# Patient Record
Sex: Male | Born: 1950
Health system: Southern US, Community
[De-identification: ages and names within clinical notes are randomized; demographics above are authoritative.]

## PROBLEM LIST (undated history)

## (undated) DIAGNOSIS — F419 Anxiety disorder, unspecified: Secondary | ICD-10-CM

## (undated) DIAGNOSIS — G4733 Obstructive sleep apnea (adult) (pediatric): Secondary | ICD-10-CM

## (undated) DIAGNOSIS — G47419 Narcolepsy without cataplexy: Principal | ICD-10-CM

## (undated) DIAGNOSIS — I10 Essential (primary) hypertension: Secondary | ICD-10-CM

## (undated) DIAGNOSIS — S0990XA Unspecified injury of head, initial encounter: Secondary | ICD-10-CM

## (undated) DIAGNOSIS — G43909 Migraine, unspecified, not intractable, without status migrainosus: Secondary | ICD-10-CM

## (undated) DIAGNOSIS — M1990 Unspecified osteoarthritis, unspecified site: Secondary | ICD-10-CM

## (undated) DIAGNOSIS — G4730 Sleep apnea, unspecified: Secondary | ICD-10-CM

## (undated) DIAGNOSIS — E119 Type 2 diabetes mellitus without complications: Secondary | ICD-10-CM

## (undated) HISTORY — DX: Sleep apnea, unspecified: G47.30

## (undated) HISTORY — DX: Obstructive sleep apnea (adult) (pediatric): G47.33

## (undated) HISTORY — DX: Migraine, unspecified, not intractable, without status migrainosus: G43.909

## (undated) HISTORY — DX: Narcolepsy without cataplexy: G47.419

## (undated) HISTORY — DX: Unspecified osteoarthritis, unspecified site: M19.90

## (undated) HISTORY — PX: TRIGGER FINGER RELEASE: SHX641

## (undated) HISTORY — DX: Anxiety disorder, unspecified: F41.9

## (undated) HISTORY — DX: Unspecified injury of head, initial encounter: S09.90XA

---

## 2005-07-05 ENCOUNTER — Inpatient Hospital Stay (HOSPITAL_COMMUNITY): Admission: EM | Admit: 2005-07-05 | Discharge: 2005-07-07 | Payer: Self-pay | Admitting: Emergency Medicine

## 2007-01-30 ENCOUNTER — Encounter (INDEPENDENT_AMBULATORY_CARE_PROVIDER_SITE_OTHER): Payer: Self-pay | Admitting: General Surgery

## 2007-01-30 ENCOUNTER — Inpatient Hospital Stay (HOSPITAL_COMMUNITY): Admission: EM | Admit: 2007-01-30 | Discharge: 2007-02-02 | Payer: Self-pay | Admitting: Emergency Medicine

## 2007-02-04 ENCOUNTER — Emergency Department (HOSPITAL_COMMUNITY): Admission: EM | Admit: 2007-02-04 | Discharge: 2007-02-04 | Payer: Self-pay | Admitting: Emergency Medicine

## 2008-01-07 HISTORY — PX: COLON SURGERY: SHX602

## 2008-06-06 ENCOUNTER — Emergency Department (HOSPITAL_COMMUNITY): Admission: EM | Admit: 2008-06-06 | Discharge: 2008-06-07 | Payer: Self-pay | Admitting: Emergency Medicine

## 2010-04-15 LAB — GLUCOSE, CAPILLARY: Glucose-Capillary: 192 mg/dL — ABNORMAL HIGH (ref 70–99)

## 2010-05-21 NOTE — H&P (Signed)
[name], [name]              ACCOUNT NO.:  [id number]   MEDICAL RECORD NO.:  [id number]          PATIENT TYPE:  INP   LOCATION:  1539                         FACILITY:  [facility]   PHYSICIAN:  Sharlet Salina T. Hoxworth, M.D.DATE OF BIRTH:  02-09-1950   DATE OF ADMISSION:  01/30/2007  DATE OF DISCHARGE:                              HISTORY & PHYSICAL   CHIEF COMPLAINT:  Severe rectal pain and swelling.   HISTORY OF PRESENT ILLNESS:  [name] is a 60 year old white male  diabetic who while straining had a bowel movement now about 6 hours ago,  developed the acute onset of rectal pain and swelling.  This was  described as severe, and presented to the [facility] emergency room.  He had some bleeding with his bowel movement, as well.  At the time of  my history, the patient has received some Valium in the ER and is a poor  historian, history taken essentially from his wife.  He apparently has  some occasional minor hemorrhoidal irritation, but no severe chronic  symptoms or anything similar to this previously.   PAST MEDICAL HISTORY:  He is a juvenile insulin-dependent diabetic, also  hypertension, severe sleep apnea for which he uses a CPAP, and has  migraine headaches.   MEDICATIONS:  Are:  1. Insulin 75/25 26 units in the morning, 36 units in the evening plus      occasional sliding scale.  2. Lisinopril 40 mg daily.  3. Citalopram 20 mg daily.  4. Clonidine 0.1 mg b.i.d.  5. Metoprolol 50 mg a day.  6. Prilosec daily.   ALLERGIES:  MORPHINE AND CODEINE AND QUESTIONABLE REACTION TO ALL  NARCOTICS, BUT HIS WIFE IS NOT SURE HE HAS HAD ANY OTHER NARCOTICS WITH  MORPHINE.   SOCIAL HISTORY:  He is married.  No cigarettes or alcohol.  Recently  laid off from work, unemployed.   FAMILY HISTORY:  Noncontributory.   REVIEW OF SYSTEMS:  GENERAL:  No fever, chills or weight change.  RESPIRATORY:  No shortness breath, cough, wheezing, history of lung  disease.  CARDIAC:  No chest  pain, palpitations, history of heart disease.  ABDOMEN:  GI no abdominal pain, nausea, vomiting.   PHYSICAL EXAM:  VITAL SIGNS:  Temperature is 99.1, pulse 84,  respirations 21, blood pressure 185/90.  GENERAL:  Mildly obese white male, responsive but somewhat sedated as  above.  SKIN:  Warm and dry.  HEENT:  No mass or thyromegaly.  Sclerae nonicteric.  No cervical or  subclavicular lymph nodes palpable.  LUNGS:  Clear without wheezing, increased work of breathing.  CARDIAC:  Regular rate and rhythm.  No edema.  No murmurs.  ABDOMEN:  Mildly obese, soft, nontender.  No masses.  RECTAL:  There are severe circumferential prolapsed internal hemorrhoids  with marked edema and early gangrenous changes, exquisitely tender.  EXTREMITIES:  No joint swelling deformity.  NEUROLOGIC:  Groggy after Valium.  Verbally responsive.  Moves  extremities x4.   LABORATORY:  White count elevated at 16,000, hemoglobin 13.4,  electrolytes normal.  Glucose 209.  EKG:  Chest x-ray pending.  ASSESSMENT/PLAN:  Severe mucosal and hemorrhoidal prolapse with edema,  early gangrenous changes.  This will require reduction and  hemorrhoidectomy in the operating room.  This was discussed with the  patient and his wife.  Risks of bleeding, infection, anesthetic  medications and complications were discussed and understood.      Lorne Skeens. Hoxworth, M.D.  Electronically Signed     BTH/MEDQ  D:  01/30/2007  T:  01/30/2007  Job:  [id number]

## 2010-05-21 NOTE — Op Note (Signed)
[name], [name]              ACCOUNT NO.:  [id number]   MEDICAL RECORD NO.:  [id number]          PATIENT TYPE:  INP   LOCATION:  1539                         FACILITY:  [facility]   PHYSICIAN:  Sharlet Salina T. Hoxworth, M.D.DATE OF BIRTH:  [birth year]   DATE OF PROCEDURE:  01/30/2007  DATE OF DISCHARGE:                               OPERATIVE REPORT   PREOPERATIVE DIAGNOSIS:  Severe prolapsed circumferential thrombosed  internal and external hemorrhoids.   POSTOPERATIVE DIAGNOSIS:  Severe prolapsed circumferential thrombosed  internal and external hemorrhoids.   SURGICAL PROCEDURE:  Extensive hemorrhoidectomy.   SURGEON:  Lorne Skeens. Hoxworth, M.D.   ASSISTANT:  Sandria Bales. Ezzard Standing, M.D.   ANESTHESIA:  General.   BRIEF HISTORY:  [name] is a 60 year old diabetic male with a  history of some minor hemorrhoid symptoms in the past.  He had straining  of a bowel movement last night, and developed the onset of severe rectal  pain, swelling and some bleeding.  He presented to the emergency room  and on examination has circumferential prolapsed combination  hemorrhoids; with thrombosis, severe edema and early gangrenous changes.  I have recommended proceeding to the operating room for urgent  hemorrhoidectomy.  The nature of the procedure, its indications, risks  of bleeding, infection have been discussed and understood.  He was  brought to the operating room for this procedure.   DESCRIPTION OF OPERATION:  The patient was brought to the operating room  and placed in the supine position on the operating room table.  General  endotracheal anesthesia was induced.  He received cefoxitin  intravenously.  He was carefully positioned in the lithotomy position,  and the perineum was sterilely prepped and draped.   Again, examination revealed very severe combination internal and  external hemorrhoids, with circumferential prolapse, thrombosis and  marked edema.  The process, however, could  be delineated as most severe  in the right posterior, right anterior and left lateral positions.  I  elected to do a 3-quadrant elliptical hemorrhoidectomy in these areas.  The largest was the right posterior.  This hemorrhoid group was grasped  with a Pennington and a 3-0 chromic suture placed at the apex of the  group.  An elliptical excision was then performed of the large  thrombosed, edematous hemorrhoid group out to the perianal skin.  It was  then dissected up off of the sphincter muscle, carefully protecting and  preserving it.  Some thrombosed areas on either side were then excised,  undermining just slightly the anoderm on either side.  Hemostasis was  obtained with cautery and then the suture was used to close the incision  in a running locking fashion; with good hemostasis.   Following this, identical excisions were performed in the left lateral  area; and a somewhat more limited excision in the right anterior area.  This left just a few obvious discrete thrombosed internal and external  hemorrhoids, which were individually just lanced and clot evacuated.   At this point, all the mucosa was completely reduced.  A perianal block  of Marcaine with epinephrine was used.  The operative site was  inspected  for hemostasis, which appeared complete.  The patient was then taken to  the recovery room in good condition.      Lorne Skeens. Hoxworth, M.D.  Electronically Signed     BTH/MEDQ  D:  01/30/2007  T:  01/30/2007  Job:  [id number]

## 2010-05-24 NOTE — Discharge Summary (Signed)
[name], [name]              ACCOUNT NO.:  [id number]   MEDICAL RECORD NO.:  [id number]          PATIENT TYPE:  INP   LOCATION:  1539                         FACILITY:  [facility]   PHYSICIAN:  Sharlet Salina T. Hoxworth, M.D.DATE OF BIRTH:  27-Jun-1950   DATE OF ADMISSION:  01/30/2007  DATE OF DISCHARGE:  02/02/2007                               DISCHARGE SUMMARY   No dictation for this chart.      Lorne Skeens. Hoxworth, M.D.     Tory Emerald  D:  02/18/2007  T:  02/19/2007  Job:  [id number]

## 2010-05-24 NOTE — Discharge Summary (Signed)
Bird, Robert              ACCOUNT NO.:  [id number]   MEDICAL RECORD NO.:  [id number]          PATIENT TYPE:  INP   LOCATION:  1539                         FACILITY:  [facility]   PHYSICIAN:  Sharlet Salina T. Hoxworth, M.D.DATE OF BIRTH:  02/07/1950   DATE OF ADMISSION:  01/30/2007  DATE OF DISCHARGE:  02/02/2007                               DISCHARGE SUMMARY   DISCHARGE DIAGNOSES:  1. Severe prolapsed thrombosed hemorrhoids.  2. Insulin-dependent diabetes mellitus.  3. Hypertension.  4. Sleep apnea.   OPERATIONS AND PROCEDURES:  Extensive hemorrhoidectomy January 30, 2007.   HISTORY OF PRESENT ILLNESS:  Mr. Voiles is a 60 year old diabetic male  who, while straining a bowel movement 6 hours ago, developed acute onset  of rectal pain and swelling.  Described as severe and constant.  He  presented to [facility] emergency room.  He had some bleeding as well.  He has had some occasional mild hemorrhoid irritation but no severe  previous symptoms.   PAST MEDICAL HISTORY:  Significant for:  1. Juvenile insulin-dependent diabetes.  2. Hypertension.  3. Severe sleep apnea for which he uses CPAP.  4. Migraine headache.   MEDICATIONS:  1. Insulin 75/25, 26 units in the morning, 36 in the evening plus      sliding scale.  2. Lisinopril 40 daily.  3. Citalopram 20 daily.  4. Clonidine 0.1 b.i.d.  5. Metoprolol 50 daily.  6. Prilosec daily.   ALLERGIES:  MORPHINE, CODEINE and questionable reaction ALL NARCOTICS.   SOCIAL HISTORY FAMILY HISTORY REVIEW OF SYSTEMS:  See detailed H&P.   PERTINENT PHYSICAL EXAMINATION:  Limited to the rectal exam which shows  severe prolapsed circumferential internal and external hemorrhoids with  marked edema, early gangrenous changes, exquisite tenderness.   HOSPITAL COURSE:  The patient was admitted from the emergency room and  taken to the OR for urgent extensive hemorrhoidectomy.  Tolerated  procedure well.  Postoperatively, he had fairly good  pain control.  Glucose was monitored with sliding scale insulin.  He had some bloody  discharge but improved.  He was going to be discharged on January 26 but  had some mild shortness of breath off oxygen and hypoxemia with O2  saturation in the 80s off oxygen.  He was observed for another 24 hours,  ambulated, and with pulmonary toilet had no further shortness of breath.  O2 saturation was around 90 on room air,  and he was discharged home  January 27. Wounds are healing well.   DISCHARGE MEDICATIONS:  Are the same as admission plus Tylox for pain  and topical Xylocaine.   Will follow up in my office in 2 weeks.      Lorne Skeens. Hoxworth, M.D.  Electronically Signed     BTH/MEDQ  D:  02/18/2007  T:  02/19/2007  Job:  [id number]

## 2010-05-24 NOTE — Discharge Summary (Signed)
[name], [name]              ACCOUNT NO.:  [id number]   MEDICAL RECORD NO.:  [id number]          PATIENT TYPE:  INP   LOCATION:  4732                         FACILITY:  [facility]   PHYSICIAN:  Kari Baars, M.D.  DATE OF BIRTH:  [birth year]   DATE OF ADMISSION:  07/05/2005  DATE OF DISCHARGE:  07/07/2005                                 DISCHARGE SUMMARY   DISCHARGE DIAGNOSES:  1.  Hypertensive urgency.  2.  Headaches, question secondary to #1 versus migraine headache.  3.  Obstructive sleep apnea.  4.  Type 1 diabetes mellitus.  5.  Lower extremity edema.   DISCHARGE MEDICATIONS:  1.  Lisinopril 40 mg daily.  2.  Norvasc 5 mg daily.  3.  Insulin 75/25 25 units q.a.m., 34 units q.p.m.  4.  Prilosec 20 mg daily.  5.  Multivitamin.  6.  Darvocet-N 100 one q.6h. p.r.n. pain, #15, zero refills.   HOSPITAL PROCEDURES:  MRI/MRA of the brain (July 1):  No acute intracranial  abnormality.  Prominent artifact from the skull base slightly limits  evaluation of temporal lobes on diffusion weighted sequence.  Small 3-4 mm  aneurysm of the cavernous segment of the left internal carotid artery cannot  be excluded on current examination.  MRA otherwise normal.   HISTORY OF PRESENT ILLNESS:  For full details please see dictated history  and physical.  Briefly, [name] is a 60 year old white male with a  history of type 1 diabetes, obstructive sleep apnea, and hypertension who  presented to the emergency department on June 30 with severe headache.  The  patient reports a history of recurrent headaches for the past two years  which have been diagnosed at Urgent Care as migraines.  These had  traditionally been responsive to NSAID therapy and occasional Imitrex use.  However, on the day of admission he had a severe headache which was  unrelenting.  Symptoms associated with the headache included nausea and  vomiting and facial flushing.  He presented to the emergency department  where his  blood pressure was in the 220s/130s.  He was given Dilaudid and  Zofran in the emergency room with an episode of turning gray and becoming  much less responsive.  He was admitted for further management.  Head CT in  the emergency department showed no acute intracranial abnormality.   HOSPITAL COURSE:  Patient was admitted to a telemetry bed.  Morphine was  used to control his pain.  His antihypertensive regimen was escalated with  an increase of lisinopril to 40 mg and addition of Norvasc 5 mg daily.  With  these changes his blood pressure quickly improved to a near normal range.  His headache also improved and he had only a mild intermittent headache  following this.  Due to his markedly elevated hypertension secondary work-up  was invoked with thyroid studies, cortisol, 24-hour urine metanephrines and  5-HIAA.  The 24-hour urine has been completed and results pending are at  this time.  To further evaluate his headache an MRI and MRA was performed.  This showed no acute intracranial abnormality.  There was a questionable  small left internal carotid aneurysm which was likely artifactual in nature  measuring 3-4 mm in size.   At that this point, the patient's blood pressure and headaches have improved  significantly.  It is possible that his headaches are due to his obstructive  sleep apnea which may be triggering migraines.  At this point, we will  discharge him home with blood pressure control.  If his headaches do recur  we will seek evaluation with a headache specialist.  Hopefully, with  addition of CPAP which he has been awaiting, the headaches will improve.   DISCHARGE DIET:  Low sodium, cardiac-prudent diet.   DISCHARGE INSTRUCTIONS:  The patient was instructed to monitor his blood  pressure daily and to call if his blood pressure is above 160/100.  He  should monitor his sugars and continue his insulin.  He will call us if his  CPAP machine is not delivered tomorrow as  anticipated.   HOSPITAL FOLLOW-UP:  He will follow up with Dr. Clelia Croft in one week at.  Follow-  up of laboratory work including his 24-hour urine results will ensue.  Should bring a blood pressure monitor log with him.      Kari Baars, M.D.  Electronically Signed     WS/MEDQ  D:  07/07/2005  T:  07/07/2005  Job:  [id number]

## 2010-05-24 NOTE — H&P (Signed)
[name], [name]              ACCOUNT NO.:  [id number]   MEDICAL RECORD NO.:  [id number]          PATIENT TYPE:  INP   LOCATION:  1825                         FACILITY:  [facility]   PHYSICIAN:  Gaspar Garbe, M.D.DATE OF BIRTH:  1950/05/27   DATE OF ADMISSION:  07/05/2005  DATE OF DISCHARGE:                                HISTORY & PHYSICAL   CHIEF COMPLAINT:  Headache, elevated blood pressure.   HISTORY OF PRESENT ILLNESS:  The patient is a 60 year old white male with a  questionable history of migraines which he has had for only the past two  years of his life as well as hypertension and diabetes mellitus type 1.  The  patient was taken to the emergency room by his wife and his mother secondary  to events that happened earlier today.  He has had these same events in the  past and has been seen by Urgent Care, but no clear source of the events  have ever been found.  The patient indicates that he has had migraines for  the past two years.  They seem to be happening during times where he is not  able to provide himself with pain medication.  He gets into fits with  nausea, vomiting, and elevated blood pressure and usually seeks care at an  Urgent Care Center.  He has once had a CAT scan prior to this  hospitalization which was negative but has never seen a neurologist or a  headache specialist with regards to this.  He was given Imitrex but has not  had an opportunity to use it in the past and does not currently have it any  further.  The patient's wife indicates that he started having headache,  nausea, and vomiting.  He turned bright red on his face, and his blood  pressure was in the 220s/130s.  She called EMS, and he was taken to the  hospital where is initial blood pressure was found to be 210/148.  The  patient was given clonidine and Phenergan as well as Zofran and Dilaudid in  the emergency room.  The wife notes a considerable history of sleep apnea  which was diagnosed  at a sleep center; however, he does not have a CPAP  machine due to some difficulty with paperwork.  She indicates that he often  falls asleep just sitting there, and he had an episode of this in the  emergency room as well which was concerning to the ER staff, considering  that he had been just given many sedative drugs and the fact that the  patient is very hard to orient because he just seemingly falls asleep.  The  wife says this is normal for him, and his mother corroborates it.  He is  being admitted to the hospital for further workup and management.   ALLERGIES:  CODEINE WHICH CAUSES NAUSEA AND VOMITING.   MEDICATIONS:  1.  Humalog mix 75/25, 24 units in the a.m. and 34 units in the p.m.  2.  Lisinopril 20 mg p.o. daily.  3.  Prilosec 20 mg p.o. daily.  4.  Multivitamin.   PAST MEDICAL HISTORY:  1.  Migraines x2 years, presumed, no formal workup done.  No other history      of neurologic imaging other than a CT at an Urgent Care Center.  2.  Presumed obstructive sleep apnea per sleep study.  He is not currently      on CPAP.  3.  Hypertension.  4.  Diabetes mellitus type 1 since age 11.   PAST SURGICAL HISTORY:  Tonsillectomy.   SOCIAL HISTORY:  The patient lives in [place] with his wife and son.  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  He is married with one daughter who lives in  [place].  He does not smoke and does not drink.  He does not have any  history of illegal drug usage per the wife or himself.  He used to exercise  three times a week but has not been feeling well in the past several weeks.   FAMILY HISTORY:  Mother is alive with history of hypothyroidism, congestive  heart failure, and diabetes.  Father died at the age of 92 of congestive  heart failure.  He has two brothers, one with diabetes, and one sister who  is diabetic and bipolar.   REVIEW OF SYSTEMS:  The patient denies any fevers, chills, or sweats at this  time.  He complains of a headache which is  actually improving at this point.  He denies any other nasal discharge, visual or hearing loss.  He denies any  skin rashes or lesions.  He denies any chest pain, shortness of breath,  dyspnea on exertion, cough, or wheezing.  He notes nausea and vomiting which  was witnessed x1 by myself during this interview.  He does not complain of  any abdominal pain.  He does seem to be lethargic, as stated above.  All  other systems are negative.   ADVANCED DIRECTIVE:  This patient is a full code.   PHYSICAL EXAMINATION:  VITAL SIGNS:  Temperature 98.3, pulse 83, respiratory  rate 20, blood pressure down to 174/99 after receiving clonidine in the  emergency room.  Initial blood pressure was 210/148.  Oxygen saturation 97%  on room air.  GENERAL:  No acute distress.  HEENT:  Normocephalic, atraumatic.  PERRLA.  EOMI.  ENT is within normal  limits.  NECK:  Supple without lymphadenopathy.  No thyromegaly is appreciated.  No  bruit is noted.  HEART:  Regular rate and rhythm.  No murmurs, rubs, or gallops.  LUNGS:  Clear to auscultation bilaterally.  ABDOMEN:  Soft, nontender.  Normoactive bowel sounds.  No  hepatosplenomegaly.  EXTREMITIES:  No clubbing, cyanosis, or edema.  NEUROLOGIC:  Oriented x3.  Cranial nerves II-XII are intact.  The patient  does fall back into sleep but is easily arousable and reorientable.  Strength is 5/5 bilaterally with normal sensation.   IMAGING STUDIES:  The patient had a noncontrasted CT of his head which was  nonacute.  EKG shows normal sinus rhythm with a heart rate of 86 without any  acuity.   LABORATORY DATA:  White count 11.7, hemoglobin 15, hematocrit 44, platelets  202.  BUN and creatinine are 20 and 1.3, respectively.  Glucose is 151;  otherwise, BMET is normal.  His BNP is less than 30.   ASSESSMENT AND PLAN:  1.  Hypertensive urgency.  The real question at this point is whether the     hypertension caused the headache or the headache the caused the  hypertension with some bit of anxiety.  I am not really sure at this      point.  Will keep him on clonidine 0.2 mg q.6h. as needed.  If his blood      pressure gets above 160, will increase his lisinopril to 40 and add      Norvasc 5 mg a day.  Will continue to follow him clinically.  In      addition, we will check thyroid, adrenal studies, as well as looking for      urine metanephrines or 5HIAA, as his symptoms seem to be episodic in      nature.  2.  Headaches.  The patient will undergo an MRI/MRA once his nausea and      vomiting calm down so that we can get an adequate study.  Clearly, it is      unusual for a person to develop headaches in their early to mid-50s of a      migraine variety, as these are very common when young.  We are concerned      about the possibility of either aneurysms or other organic brain      syndrome.  We will also check a urine drug screen.  If the MRA portion      appears to be abnormal, we will look into a vasculitic workup, although      there does not appear to be any lateralization which would be consistent      with this, and it seems to be more episodic than continued.  3.  Obstructive sleep apnea.  Will monitor the patient in the hospital.      Given his nausea and vomiting, I do not feel that continuous positive      airway pressure at this point would be a good idea.  However, he will      most likely need it set up prior to his discharge which had not been      done for whatever reason.  The family is not terribly clear with where      the problem lies.  4.  Phenergan for nausea and vomiting.  5.  We will continue the patient on telemetry to rule out wide complex      tachycardias.  6.  Diabetes mellitus type 1.  We will put him 10 units b.i.d. of NPH plus a      sliding scale rather than his mix, as his oral intake is likely to be      decreased if his nausea and vomiting does not resolve.  7.  We will use Lovenox for deep venous  thrombosis prophylaxis and Protonix      for gastrointestinal prophylaxis.  8.  We will provide a clear liquid diet and advance as tolerated.      Gaspar Garbe, M.D.  Electronically Signed     RWT/MEDQ  D:  07/05/2005  T:  07/06/2005  Job:  [id number]   cc:   Kari Baars, M.D.  Fax: [phone]

## 2010-09-06 ENCOUNTER — Encounter (HOSPITAL_BASED_OUTPATIENT_CLINIC_OR_DEPARTMENT_OTHER): Payer: Worker's Compensation | Attending: General Surgery

## 2010-09-06 DIAGNOSIS — E119 Type 2 diabetes mellitus without complications: Secondary | ICD-10-CM | POA: Insufficient documentation

## 2010-09-06 DIAGNOSIS — Z794 Long term (current) use of insulin: Secondary | ICD-10-CM | POA: Insufficient documentation

## 2010-09-06 DIAGNOSIS — Z79899 Other long term (current) drug therapy: Secondary | ICD-10-CM | POA: Insufficient documentation

## 2010-09-06 DIAGNOSIS — Y929 Unspecified place or not applicable: Secondary | ICD-10-CM | POA: Insufficient documentation

## 2010-09-06 DIAGNOSIS — Y939 Activity, unspecified: Secondary | ICD-10-CM | POA: Insufficient documentation

## 2010-09-06 DIAGNOSIS — Y999 Unspecified external cause status: Secondary | ICD-10-CM | POA: Insufficient documentation

## 2010-09-06 DIAGNOSIS — S91009A Unspecified open wound, unspecified ankle, initial encounter: Secondary | ICD-10-CM | POA: Insufficient documentation

## 2010-09-06 DIAGNOSIS — S81009A Unspecified open wound, unspecified knee, initial encounter: Secondary | ICD-10-CM | POA: Insufficient documentation

## 2010-09-06 DIAGNOSIS — I10 Essential (primary) hypertension: Secondary | ICD-10-CM | POA: Insufficient documentation

## 2010-09-13 ENCOUNTER — Encounter (HOSPITAL_BASED_OUTPATIENT_CLINIC_OR_DEPARTMENT_OTHER): Payer: Worker's Compensation | Attending: General Surgery

## 2010-09-13 DIAGNOSIS — Y999 Unspecified external cause status: Secondary | ICD-10-CM | POA: Insufficient documentation

## 2010-09-13 DIAGNOSIS — S81009A Unspecified open wound, unspecified knee, initial encounter: Secondary | ICD-10-CM | POA: Insufficient documentation

## 2010-09-13 DIAGNOSIS — S91009A Unspecified open wound, unspecified ankle, initial encounter: Secondary | ICD-10-CM | POA: Insufficient documentation

## 2010-09-13 DIAGNOSIS — Z794 Long term (current) use of insulin: Secondary | ICD-10-CM | POA: Insufficient documentation

## 2010-09-13 DIAGNOSIS — Z79899 Other long term (current) drug therapy: Secondary | ICD-10-CM | POA: Insufficient documentation

## 2010-09-13 DIAGNOSIS — Y929 Unspecified place or not applicable: Secondary | ICD-10-CM | POA: Insufficient documentation

## 2010-09-13 DIAGNOSIS — I10 Essential (primary) hypertension: Secondary | ICD-10-CM | POA: Insufficient documentation

## 2010-09-13 DIAGNOSIS — Y939 Activity, unspecified: Secondary | ICD-10-CM | POA: Insufficient documentation

## 2010-09-13 DIAGNOSIS — E119 Type 2 diabetes mellitus without complications: Secondary | ICD-10-CM | POA: Insufficient documentation

## 2010-09-27 LAB — COMPREHENSIVE METABOLIC PANEL
ALT: 32
Albumin: 3.7
CO2: 32
Creatinine, Ser: 1.66 — ABNORMAL HIGH
GFR calc Af Amer: 52 — ABNORMAL LOW
GFR calc non Af Amer: 43 — ABNORMAL LOW
Glucose, Bld: 209 — ABNORMAL HIGH
Potassium: 4
Sodium: 139
Total Protein: 6.8

## 2010-09-27 LAB — DIFFERENTIAL
Basophils Relative: 1
Basophils Relative: 1
Eosinophils Absolute: 0.2
Eosinophils Absolute: 0.5
Eosinophils Relative: 1
Eosinophils Relative: 5
Lymphocytes Relative: 8 — ABNORMAL LOW
Lymphs Abs: 1
Monocytes Absolute: 1.1 — ABNORMAL HIGH
Monocytes Relative: 4

## 2010-09-27 LAB — CBC
HCT: 38.5 — ABNORMAL LOW
Hemoglobin: 13.4
MCHC: 34.5
MCV: 86.1
Platelets: 210
RBC: 3.61 — ABNORMAL LOW
WBC: 16 — ABNORMAL HIGH
WBC: 9

## 2010-11-25 NOTE — H&P (Signed)
  [name], [name]              ACCOUNT NO.:  [id number]  MEDICAL RECORD NO.:  [id number]  LOCATION:  FOOT                         FACILITY:  [facility]  PHYSICIAN:  Ardath Sax, M.D.     DATE OF BIRTH:  1950/03/13  DATE OF ADMISSION:  09/06/2010 DATE OF DISCHARGE:                             HISTORY & PHYSICAL   A 60 year old man diabetic, hypertensive.  He is on hydrochlorothiazide. He is also on Prilosec for ulcer disease.  He comes here because he struck his right leg and now he has a necrotic ulcer, it is about a centimeter in diameter.  It had dead skin there that easily came off but it appeared to have some dermis that was intact and just a little area of subcutaneous tissue showing.  He was treated with Santyl and I gave him a prescription for that.  I also renewed a prescription he had for Septra and he will come back in a week.  This patient is on lisinopril for hypertension.  He takes Humulin 70/30.  He is also on hydrochlorothiazide.  The patient was afebrile.  His blood pressure was 140/90 and I think this should heal very nicely.  The cause of this is trauma.  He hit the leg while he was getting out of a vehicle.  He has very good pulses and he takes good care of his diabetes.  So, his diagnosis is traumatic wound to the anterior aspect of his right leg treated with debridement and Santyl.     Ardath Sax, M.D.     PP/MEDQ  D:  09/06/2010  T:  09/06/2010  Job:  [id number]  Electronically Signed by Ardath Sax  on 11/25/2010 02:46:49 PM

## 2011-10-27 ENCOUNTER — Emergency Department (HOSPITAL_BASED_OUTPATIENT_CLINIC_OR_DEPARTMENT_OTHER)
Admission: EM | Admit: 2011-10-27 | Discharge: 2011-10-28 | Disposition: A | Discharge status: Home / Self Care | Payer: Worker's Compensation | Attending: Emergency Medicine | Admitting: Emergency Medicine

## 2011-10-27 ENCOUNTER — Other Ambulatory Visit: Payer: Self-pay

## 2011-10-27 ENCOUNTER — Encounter (HOSPITAL_BASED_OUTPATIENT_CLINIC_OR_DEPARTMENT_OTHER): Payer: Self-pay | Admitting: Emergency Medicine

## 2011-10-27 DIAGNOSIS — Y9389 Activity, other specified: Secondary | ICD-10-CM | POA: Insufficient documentation

## 2011-10-27 DIAGNOSIS — T23229A Burn of second degree of unspecified single finger (nail) except thumb, initial encounter: Secondary | ICD-10-CM | POA: Insufficient documentation

## 2011-10-27 DIAGNOSIS — I10 Essential (primary) hypertension: Secondary | ICD-10-CM | POA: Insufficient documentation

## 2011-10-27 DIAGNOSIS — T23201A Burn of second degree of right hand, unspecified site, initial encounter: Secondary | ICD-10-CM

## 2011-10-27 DIAGNOSIS — W868XXA Exposure to other electric current, initial encounter: Secondary | ICD-10-CM | POA: Insufficient documentation

## 2011-10-27 DIAGNOSIS — E119 Type 2 diabetes mellitus without complications: Secondary | ICD-10-CM | POA: Insufficient documentation

## 2011-10-27 DIAGNOSIS — Y9269 Other specified industrial and construction area as the place of occurrence of the external cause: Secondary | ICD-10-CM | POA: Insufficient documentation

## 2011-10-27 HISTORY — DX: Type 2 diabetes mellitus without complications: E11.9

## 2011-10-27 HISTORY — DX: Essential (primary) hypertension: I10

## 2011-10-27 LAB — CBC WITH DIFFERENTIAL/PLATELET
Basophils Relative: 1 % (ref 0–1)
MCHC: 35 g/dL (ref 30.0–36.0)
MCV: 83.2 fL (ref 78.0–100.0)
Monocytes Absolute: 1 10*3/uL (ref 0.1–1.0)
Monocytes Relative: 10 % (ref 3–12)
Neutrophils Relative %: 58 % (ref 43–77)
Platelets: 204 10*3/uL (ref 150–400)

## 2011-10-27 LAB — GLUCOSE, CAPILLARY
Glucose-Capillary: 191 mg/dL — ABNORMAL HIGH (ref 70–99)
Glucose-Capillary: 191 mg/dL — ABNORMAL HIGH (ref 70–99)

## 2011-10-27 MED ORDER — SILVER SULFADIAZINE 1 % EX CREA
TOPICAL_CREAM | Freq: Once | CUTANEOUS | Status: AC
  Administered 2011-10-27: 1 via TOPICAL
  Filled 2011-10-27: qty 85

## 2011-10-27 MED ORDER — IBUPROFEN 800 MG PO TABS
800.0000 mg | ORAL_TABLET | Freq: Once | ORAL | Status: AC
  Administered 2011-10-27: 800 mg via ORAL
  Filled 2011-10-27: qty 1

## 2011-10-27 MED ORDER — SODIUM CHLORIDE 0.9 % IV BOLUS (SEPSIS)
1000.0000 mL | Freq: Once | INTRAVENOUS | Status: AC
  Administered 2011-10-27: 1000 mL via INTRAVENOUS

## 2011-10-27 NOTE — ED Provider Notes (Addendum)
History   This chart was scribed for [name] Robert Cords, MD by Sofie Rower. The patient was seen in room MH03/MH03 and the patient's care was started at 10:59PM.     CSN: [id number]  Arrival date & time 10/27/11  2057   First MD Initiated Contact with Patient 10/27/11 2259      Chief Complaint  Patient presents with  . Hand Burn  . Electric Shock    (Consider location/radiation/quality/duration/timing/severity/associated sxs/prior treatment) Patient is a 61 y.o. male presenting with burn. The history is provided by the patient. No language interpreter was used.  Burn The incident occurred 6 to 12 hours ago. The burns occurred at a job site. The burns occurred while working on a project. Injury mechanism: Contact with an electric wall switch which sparked and sparks contacted the right hand.  Left hand was not touching the wall nor the switch.   The burns are located on the right hand. The burns appear blistered and painful. The pain is moderate. He has tried nothing for the symptoms. The treatment provided no relief.    [name] is a 61 y.o. male  who presents to the Emergency Department complaining of  sudden, moderate, hand burn located at the right hand, onset today. Associated symptoms include blisters located at the right index finger. Switched a switch and it sparked and burned middle index finger and thenar eminence of the right hand Modifying factors include certain movements and positions of the right index finger which intensifies the pain from the hand burn. The pt has a hx of diabetes and hypertension.   The pt denies any LOC associated with the hand burn.    The pt does not smoke or drink alcohol.   PCP is Dr. Marinda Elk [facility])   Past Medical History  Diagnosis Date  . Hypertension   . Diabetes mellitus without complication     Past Surgical History  Procedure Date  . Colon surgery     No family history on file.  History    Substance Use Topics  . Smoking status: Never Smoker   . Smokeless tobacco: Never Used  . Alcohol Use: No      Review of Systems  Skin: Positive for wound.  All other systems reviewed and are negative.    Allergies  Codeine; Dilaudid; Morphine and related; Penicillins; and Toradol  Home Medications   Current Outpatient Rx  Name Route Sig Dispense Refill  . AMLODIPINE BESYLATE 5 MG PO TABS Oral Take 5 mg by mouth daily.    [name] CITALOPRAM HYDROBROMIDE 20 MG PO TABS Oral Take 20 mg by mouth daily.    [name] HYDROCHLOROTHIAZIDE 12.5 MG PO CAPS Oral Take 12.5 mg by mouth daily.    . INSULIN GLARGINE 100 UNIT/ML [place] SOLN Subcutaneous Inject 50 Units into the skin at bedtime.    . INSULIN LISPRO (HUMAN) 100 UNIT/ML [place] SOLN Subcutaneous Inject 46 Units into the skin 3 (three) times daily before meals.    [name] LISINOPRIL 20 MG PO TABS Oral Take 20 mg by mouth daily.      BP 170/100  Pulse 93  Temp 98 F (36.7 C) (Oral)  Resp 16  Ht 6\' 3"  (1.905 m)  Wt 237 lb (107.502 kg)  BMI 29.62 kg/m2  SpO2 97%  Physical Exam  Nursing note and vitals reviewed. Constitutional: He is oriented to person, place, and time. He appears well-developed and well-nourished.  HENT:  Head: Normocephalic and atraumatic.  Nose:  Nose normal.  Mouth/Throat: Oropharynx is clear and moist.  Eyes: Conjunctivae normal and EOM are normal. Pupils are equal, round, and reactive to light.  Neck: Normal range of motion.  Cardiovascular: Normal rate, regular rhythm and normal heart sounds.   Pulmonary/Chest: Effort normal and breath sounds normal. He has no wheezes. He has no rales.  Abdominal: Soft. Bowel sounds are normal.  Musculoskeletal: Normal range of motion.  Neurological: He is alert and oriented to person, place, and time.       Sensation is intact. FROM of all fingers of the right hand cap refill < 2 sec  Skin: Skin is warm and dry.     Psychiatric: He has a normal mood and affect. His behavior is normal.     ED Course  Procedures (including critical care time)  DIAGNOSTIC STUDIES: Oxygen Saturation is 97% on room air, normal by my interpretation.    COORDINATION OF CARE:   11:26 PM- Treatment plan concerning pain management and follow up with burn center at [facility] discussed with patient. Pt agrees with treatment.    11:38PM- Phone consult with Dr. Dawna Part concerning the condition of the right hand burn. Dr. Dawna Part agrees to evaluate patient within his clinic as a follow up patient.    Results for orders placed during the hospital encounter of 10/27/11  GLUCOSE, CAPILLARY      Component Value Range   Glucose-Capillary 191 (*) 70 - 99 mg/dL  CBC WITH DIFFERENTIAL      Component Value Range   WBC 9.7  4.0 - 10.5 K/uL   RBC 4.40  4.22 - 5.81 MIL/uL   Hemoglobin 12.8 (*) 13.0 - 17.0 g/dL   HCT 40.9 (*) 81.1 - 91.4 %   MCV 83.2  78.0 - 100.0 fL   MCH 29.1  26.0 - 34.0 pg   MCHC 35.0  30.0 - 36.0 g/dL   RDW 78.2  95.6 - 21.3 %   Platelets 204  150 - 400 K/uL   Neutrophils Relative 58  43 - 77 %   Neutro Abs 5.7  1.7 - 7.7 K/uL   Lymphocytes Relative 25  12 - 46 %   Lymphs Abs 2.4  0.7 - 4.0 K/uL   Monocytes Relative 10  3 - 12 %   Monocytes Absolute 1.0  0.1 - 1.0 K/uL   Eosinophils Relative 6 (*) 0 - 5 %   Eosinophils Absolute 0.6  0.0 - 0.7 K/uL   Basophils Relative 1  0 - 1 %   Basophils Absolute 0.1  0.0 - 0.1 K/uL  BASIC METABOLIC PANEL      Component Value Range   Sodium 139  135 - 145 mEq/L   Potassium 4.6  3.5 - 5.1 mEq/L   Chloride 100  96 - 112 mEq/L   CO2 27  19 - 32 mEq/L   Glucose, Bld 206 (*) 70 - 99 mg/dL   BUN 29 (*) 6 - 23 mg/dL   Creatinine, Ser 0.86 (*) 0.50 - 1.35 mg/dL   Calcium 9.6  8.4 - 57.8 mg/dL   GFR calc non Af Amer 39 (*) >90 mL/min   GFR calc Af Amer 45 (*) >90 mL/min  GLUCOSE, CAPILLARY      Component Value Range   Glucose-Capillary 191 (*) 70 - 99 mg/dL   Comment 1 Notify RN      No results found.   No diagnosis  found.    Patient refused any pain medication except for ibuprofen  as all other pain medications cause him to "code"  Patient stated this to EDP with scribe present  Date: 10/28/2011  Rate: 82  Rhythm: normal sinus rhythm  QRS Axis: normal  Intervals: normal  ST/T Wave abnormalities: normal  Conduction Disutrbances: none  Narrative Interpretation: unremarkable       MDM  Case d/w Dr. Adriana Mccallum, refer to [facility] Case d/w Dr. Dawna Part burn attending at [facility], as was a switch and patient did not complete circuit no indication for admission at this time.  Call in am to be seen with him at burn clinic.    Patient observed and hydrated.  Informed of change in creatinine since 2009 and need to follow up with PMD regarding same.  Return immediately for any redness streaking up the arms dark urine or any concerns.  Patient and wife verbalize understanding and agree to follow up   I personally performed the services described in this documentation, which was scribed in my presence. The recorded information has been reviewed and considered.  [place] updated      Braelynne Garinger K Onesty Clair-Rasch, MD 10/28/11 1610  Jasmine Awe, MD 10/28/11 [phone]

## 2011-10-27 NOTE — ED Provider Notes (Signed)
10:55 PM  Date: 10/27/2011  Rate: 82  Rhythm: normal sinus rhythm  QRS Axis: left  Intervals: normal  ST/T Wave abnormalities: normal  Conduction Disutrbances:none  Narrative Interpretation: Normal EKG  Old EKG Reviewed: unchanged    Carleene Cooper III, MD 10/27/11 2256

## 2011-10-27 NOTE — ED Notes (Signed)
Took patient's blood sugar, result was: 191. Nurse notified.

## 2011-10-27 NOTE — ED Notes (Signed)
Dr. Palumbo at bedside. 

## 2011-10-27 NOTE — ED Notes (Signed)
Electric switch "blew up", sending electricity through right hand.  Burn and blistering to right index finger.  Black charring on fingers and palm.

## 2011-10-28 LAB — BASIC METABOLIC PANEL
CO2: 27 mEq/L (ref 19–32)
Calcium: 9.6 mg/dL (ref 8.4–10.5)
Creatinine, Ser: 1.8 mg/dL — ABNORMAL HIGH (ref 0.50–1.35)
GFR calc Af Amer: 45 mL/min — ABNORMAL LOW (ref >90)
GFR calc non Af Amer: 39 mL/min — ABNORMAL LOW (ref >90)
Glucose, Bld: 206 mg/dL — ABNORMAL HIGH (ref 70–99)
Potassium: 4.6 mEq/L (ref 3.5–5.1)
Sodium: 139 mEq/L (ref 135–145)

## 2011-10-28 MED ORDER — TETANUS-DIPHTH-ACELL PERTUSSIS 5-2.5-18.5 LF-MCG/0.5 IM SUSP
0.5000 mL | Freq: Once | INTRAMUSCULAR | Status: AC
  Administered 2011-10-28: 0.5 mL via INTRAMUSCULAR
  Filled 2011-10-28: qty 0.5

## 2011-10-28 MED ORDER — IBUPROFEN 800 MG PO TABS: 800.0000 mg | ORAL_TABLET | Freq: Three times a day (TID) | ORAL | Status: DC

## 2011-10-28 MED ORDER — SILVER SULFADIAZINE 1 % EX CREA: TOPICAL_CREAM | Freq: Two times a day (BID) | CUTANEOUS | Status: DC

## 2012-09-16 ENCOUNTER — Emergency Department (HOSPITAL_COMMUNITY)
Admission: EM | Admit: 2012-09-16 | Discharge: 2012-09-16 | Disposition: A | Discharge status: Home / Self Care | Payer: Medicare Other | Attending: Emergency Medicine | Admitting: Emergency Medicine

## 2012-09-16 ENCOUNTER — Encounter (HOSPITAL_COMMUNITY): Payer: Self-pay | Admitting: Emergency Medicine

## 2012-09-16 ENCOUNTER — Emergency Department (HOSPITAL_COMMUNITY): Payer: Medicare Other

## 2012-09-16 DIAGNOSIS — IMO0002 Reserved for concepts with insufficient information to code with codable children: Secondary | ICD-10-CM

## 2012-09-16 DIAGNOSIS — I10 Essential (primary) hypertension: Secondary | ICD-10-CM | POA: Insufficient documentation

## 2012-09-16 DIAGNOSIS — S0990XA Unspecified injury of head, initial encounter: Secondary | ICD-10-CM | POA: Insufficient documentation

## 2012-09-16 DIAGNOSIS — W1809XA Striking against other object with subsequent fall, initial encounter: Secondary | ICD-10-CM | POA: Insufficient documentation

## 2012-09-16 DIAGNOSIS — Z79899 Other long term (current) drug therapy: Secondary | ICD-10-CM | POA: Insufficient documentation

## 2012-09-16 DIAGNOSIS — S0180XA Unspecified open wound of other part of head, initial encounter: Secondary | ICD-10-CM | POA: Insufficient documentation

## 2012-09-16 DIAGNOSIS — Z880 Allergy status to penicillin: Secondary | ICD-10-CM | POA: Insufficient documentation

## 2012-09-16 DIAGNOSIS — Y9383 Activity, rough housing and horseplay: Secondary | ICD-10-CM | POA: Insufficient documentation

## 2012-09-16 DIAGNOSIS — E119 Type 2 diabetes mellitus without complications: Secondary | ICD-10-CM | POA: Insufficient documentation

## 2012-09-16 DIAGNOSIS — Y9289 Other specified places as the place of occurrence of the external cause: Secondary | ICD-10-CM | POA: Insufficient documentation

## 2012-09-16 DIAGNOSIS — Z794 Long term (current) use of insulin: Secondary | ICD-10-CM | POA: Insufficient documentation

## 2012-09-16 HISTORY — DX: Unspecified injury of head, initial encounter: S09.90XA

## 2012-09-16 MED ORDER — ACETAMINOPHEN 325 MG PO TABS
650.0000 mg | ORAL_TABLET | Freq: Once | ORAL | Status: AC
  Administered 2012-09-16: 650 mg via ORAL
  Filled 2012-09-16: qty 2

## 2012-09-16 MED ORDER — LIDOCAINE-EPINEPHRINE (PF) 2 %-1:200000 IJ SOLN
INTRAMUSCULAR | Status: AC
  Filled 2012-09-16: qty 10

## 2012-09-16 MED ORDER — CEPHALEXIN 500 MG PO CAPS: 500.0000 mg | ORAL_CAPSULE | Freq: Four times a day (QID) | ORAL | Status: DC

## 2012-09-16 NOTE — ED Notes (Signed)
Bed: WA07 Expected date:  Expected time:  Means of arrival:  Comments: EMS-fall 

## 2012-09-16 NOTE — ED Notes (Signed)
MD at bedside. 

## 2012-09-16 NOTE — Progress Notes (Signed)
Patient confirms his pcp is Dr. Youlanda Mighty.  System updated.

## 2012-09-16 NOTE — ED Notes (Signed)
Per EMS- Patient states he was rough housing with his brother and tripped falling into a door jamb. Patient has a swollen left ey with an abrasion of the left eyebrow area and a laceration to the bridge of his nose that extends between the eye and nose. Patient has an insulin pump that EMS turned off due to patient c/o nausea and diaphoresis. CBG-80. Patient was placed on a c-cp;;ar when he c/o cervical amd posterior neck pain while en route to the ED.

## 2012-09-16 NOTE — Progress Notes (Signed)
[facility] provided pt with a list of primary care resources and a [facility] [employer] Card application.  °

## 2012-09-16 NOTE — ED Provider Notes (Signed)
TIME SEEN: 4:25 PM  CHIEF COMPLAINT: Head injury, laceration  HPI: Patient is a 62 y.o. male with a history of diabetes, hypertension who presents the emergency department with a laceration to his face after he was roughhousing with his brother and tripped and fell into a door jam. There is no loss of consciousness. Patient does not have any vision or hearing changes. He describes his headache as mild, throbbing and frontal. No radiation. No aggravating or alleviating factors. Denies any numbness, tingling or focal weakness. No vomiting.  ROS: See HPI Constitutional: no fever  Eyes: no drainage  ENT: no runny nose   Cardiovascular:  no chest pain  Resp: no SOB  GI: no vomiting GU: no dysuria Integumentary: no rash  Allergy: no hives  Musculoskeletal: no leg swelling  Neurological: no slurred speech ROS otherwise negative  PAST MEDICAL HISTORY/PAST SURGICAL HISTORY:  Past Medical History  Diagnosis Date  . Hypertension   . Diabetes mellitus without complication     MEDICATIONS:  Prior to Admission medications   Medication Sig Start Date End Date Taking? Authorizing Provider  amLODipine (NORVASC) 5 MG tablet Take 5 mg by mouth daily.   Yes Historical Provider, MD  CINNAMON PO Take 2 tablets by mouth daily.   Yes Historical Provider, MD  citalopram (CELEXA) 20 MG tablet Take 20 mg by mouth daily.   Yes Historical Provider, MD  cloNIDine (CATAPRES) 0.1 MG tablet Take 0.1 mg by mouth daily as needed (for high BP).   Yes Historical Provider, MD  hydrochlorothiazide (MICROZIDE) 12.5 MG capsule Take 12.5 mg by mouth daily.   Yes Historical Provider, MD  ibuprofen (ADVIL,MOTRIN) 200 MG tablet Take 200-400 mg by mouth every 6 (six) hours as needed for pain.   Yes Historical Provider, MD  ibuprofen (ADVIL,MOTRIN) 800 MG tablet Take 1 tablet (800 mg total) by mouth 3 (three) times daily. 10/28/11  Yes April K Palumbo-Rasch, MD  Insulin Human (INSULIN PUMP) 100 unit/ml SOLN Inject 70 each  into the skin continuous. Humalog insulin   Yes Historical Provider, MD  lisinopril (PRINIVIL,ZESTRIL) 20 MG tablet Take 20 mg by mouth daily.   Yes Historical Provider, MD  Multiple Vitamin (MULTIVITAMIN WITH MINERALS) TABS tablet Take 1 tablet by mouth daily.   Yes Historical Provider, MD  Polyethyl Glycol-Propyl Glycol (SYSTANE) 0.4-0.3 % SOLN Apply 2 drops to eye 3 (three) times daily.   Yes Historical Provider, MD  vitamin C (ASCORBIC ACID) 500 MG tablet Take 500 mg by mouth daily.   Yes Historical Provider, MD    ALLERGIES:  Allergies  Allergen Reactions  . Codeine Nausea And Vomiting    Last time, came to ED  . Dilaudid [Hydromorphone Hcl] Anaphylaxis    Patient goes into cardiac arrest  . Gluten Meal Other (See Comments)    Severe migraines  . Morphine And Related Anaphylaxis  . Toradol [Ketorolac Tromethamine] Nausea And Vomiting  . Penicillins Other (See Comments)    Unknown     SOCIAL HISTORY:  History  Substance Use Topics  . Smoking status: Never Smoker   . Smokeless tobacco: Never Used  . Alcohol Use: No    FAMILY HISTORY: History reviewed. No pertinent family history.  EXAM: BP 130/75  Pulse 73  Temp(Src) 98.4 F (36.9 C) (Oral)  Resp 18  SpO2 95% CONSTITUTIONAL: Alert and oriented and responds appropriately to questions. Well-appearing; well-nourished; GCS 15 HEAD: Normocephalic; complex laceration to the for head and upper left eyelid that is approximately a total of  10 cm in length EYES: Conjunctivae clear, PERRL, EOMI ENT: normal nose; no rhinorrhea; moist mucous membranes; pharynx without lesions noted; no dental injury; no hemotypanum; no septal hematoma NECK: Supple, no meningismus, no LAD; no midline spinal tenderness, step-off or deformity CARD: RRR; S1 and S2 appreciated; no murmurs, no clicks, no rubs, no gallops RESP: Normal chest excursion without splinting or tachypnea; breath sounds clear and equal bilaterally; no wheezes, no rhonchi, no  rales; chest wall stable, nontender to palpation ABD/GI: Normal bowel sounds; non-distended; soft, non-tender, no rebound, no guarding PELVIS:  stable, nontender to palpation BACK:  The back appears normal and is non-tender to palpation, there is no CVA tenderness; no midline spinal tenderness, step-off or deformity EXT: Normal ROM in all joints; non-tender to palpation; no edema; normal capillary refill; no cyanosis    SKIN: Normal color for age and race; warm NEURO: Moves all extremities equally PSYCH: The patient's mood and manner are appropriate. Grooming and personal hygiene are appropriate.  MEDICAL DECISION MAKING: Patient with facial laceration and head injury. He is neurologically intact. He is hemodynamically stable. His last tetanus vaccination was one year ago. Will obtain CT of his head and repair his laceration. We'll give Tylenol for pain.  ED PROGRESS: Patient's head CT is negative. Laceration repaired. Given he is a diabetic and did have a small area next to his medial eye with a deeper pocket that I was unable to repair with deep sutures as there was no deep tissue appropriate for suturing, I will start antibiotics. Given strict return precautions. Patient has PCP followup. Patient and family verbalized understanding and are comfortable with plan.  LACERATION REPAIR Performed by: Raelyn Number Authorized by: Raelyn Number Consent: Verbal consent obtained. Risks and benefits: risks, benefits and alternatives were discussed Consent given by: patient Patient identity confirmed: provided demographic data Prepped and Draped in normal sterile fashion Wound explored  Laceration Location: face  Laceration Length: 10 cm  No Foreign Bodies seen or palpated  Anesthesia: local infiltration  Local anesthetic: lidocaine 2% with  epinephrine  Anesthetic total: 6 ml  Irrigation method: syringe Amount of cleaning: standard  Skin closure: simple  Number of sutures:  11  Technique: Patient was irrigated using normal saline. He was prepped with Betadine and sterile towels in the usual fashion. 11 simple interrupted sutures were placed to close the wound. Hemostasis was achieved. Bacitracin was applied to the wound.   Patient tolerance: Patient tolerated the procedure well with no immediate complications.    Layla Maw Ward, DO 09/16/12 1649

## 2012-09-24 ENCOUNTER — Encounter: Payer: Self-pay | Admitting: Neurology

## 2012-09-24 ENCOUNTER — Ambulatory Visit (INDEPENDENT_AMBULATORY_CARE_PROVIDER_SITE_OTHER): Payer: Medicare Other | Admitting: Neurology

## 2012-09-24 VITALS — BP 148/82 | HR 83 | Resp 18 | Ht 75.5 in | Wt 239.0 lb

## 2012-09-24 DIAGNOSIS — E1059 Type 1 diabetes mellitus with other circulatory complications: Secondary | ICD-10-CM

## 2012-09-24 DIAGNOSIS — G4737 Central sleep apnea in conditions classified elsewhere: Secondary | ICD-10-CM

## 2012-09-24 DIAGNOSIS — G4730 Sleep apnea, unspecified: Secondary | ICD-10-CM | POA: Insufficient documentation

## 2012-09-24 NOTE — Patient Instructions (Signed)
Sleep Apnea  Sleep apnea is disorder that affects a person's sleep. A person with sleep apnea has abnormal pauses in their breathing when they sleep. It is hard for them to get a good sleep. This makes a person tired during the day. It also can lead to other physical problems. There are three types of sleep apnea. One type is when breathing stops for a short time because your airway is blocked (obstructive sleep apnea). Another type is when the brain sometimes fails to give the normal signal to breathe to the muscles that control your breathing (central sleep apnea). The third type is a combination of the other two types.  HOME CARE  · Do not sleep on your back. Try to sleep on your side.  · Take all medicine as told by your doctor.  · Avoid alcohol, calming medicines (sedatives), and depressant drugs.  · Try to lose weight if you are overweight. Talk to your doctor about a healthy weight goal.  Your doctor may have you use a device that helps to open your airway. It can help you get the air that you need. It is called a positive airway pressure (PAP) device. There are three types of PAP devices:  · Continuous positive airway pressure (CPAP) device.  · Nasal expiratory positive airway pressure (EPAP) device.  · Bilevel positive airway pressure (BPAP) device.  MAKE SURE YOU:  · Understand these instructions.  · Will watch your condition.  · Will get help right away if you are not doing well or get worse.  Document Released: 10/02/2007 Document Revised: 12/10/2011 Document Reviewed: 04/26/2011  [employer]® Patient Information ©2014 [employer].

## 2012-09-24 NOTE — Progress Notes (Signed)
[facility]  Provider:  Melvyn Bird, M D  Referring Provider: No ref. provider found Primary Care Physician:  Robert Baars, MD  Chief Complaint  Patient presents with  . Follow-up    OSA,rm 11    HPI:  [name] is a 62 y.o. male  Is seen here as a referral/ revisit  from Dr. Buren Kos, his internist, for Sleep apnea follow up.  [name] is an established patient - since 2012, a  meanwhile 62 year old Caucasian right-handed gentleman, with  a history of complex apnea.   He had sleep complains of persistent excessive daytime sleepiness despite nightly use of CPAP associated with restless legs and snoring. The patient also has a long-standing history of type 1 diabetes. A split-night study was performed on 01-22-11 showed evidence of CPAP and was central apneas and the patient was asked to return to use a BiPAP with backup rate or an adaptive serval ventilation device. At the time in beverages doesn't 13 the patient endorsed the Epworth sleepiness score at 17/23 points and the backs inventory at 16 points his BMI was 30.2 his neck circumference 17.5 inches. The patient responded best to adapt SV is an EEP- Expiratory pressure of 10 cm,  and pressure support of 4 cm minimum and 15 cm maximum.  He did have problems is continued periodic limb movements at over 60 per hour and associated arousals at about 22 per hour.  The patient today right knee a download from his recent machine but short and residual AHI of 12.6 thereby complete resolution of his apnea. Is using a minimum pressure support of 4 cm water maximum pressure support of 10 cm water and EP a P. at 10 cm. This date had download spans from 07/14/2012 2 today 09/23/2012. The average time in therapy per day 8 hours and 7 minutes the patient is 100% compliant with its use over 12 month. . His health overall has improved, he meanwhile uses an insulin pump ( 2 month ago) he lost weight, he has lower blood   sugars , Hba1c was 6.4.he lost over 12 pounds on the pump.   His memory concerns have been present, but he feels confident that he has not progressed. His last MMSE in 02-2011 was 29-30 and AFT  22, last Epworth 04-2011 was 14 points.     Nocturia once at Night. Sleep habits are unchanged, no snoring reported by spouse, the patient is going to bed around same time on week days as week hands, for about 8 hours at night.he goes to bed around 10.30 pm ,falls asleep promptly, less than 5 minutes.  Rises at  8 AM , may stay dozing before rising. He has no trouble staying asleep. He likes his machine and he has taken charge of his overall  Health.    Epworth today 12 points from 14 in April 2013  and from 17 before sleep study in  01-2011 .  FSS 30 points, GDS 5 points.    Review of Systems: Out of a complete 14 system review, the patient complains of only the following symptoms, and all other reviewed systems are negative. EDS, Fatigue improved, nocturia improved, limb movement, RLS neuropathy.    History   Social History  . Marital Status: Married    Spouse Name: [name]    Number of Children: 1  . Years of Education: 22   Occupational History  .      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 Smoking status: Never Smoker   . Smokeless tobacco: Never Used  . Alcohol Use: No  . Drug Use: No  . Sexual Activity: Not on file   Other Topics Concern  . Not on file   Social History Narrative  . No narrative on file    Family History  Problem Relation Age of Onset  . Arthritis Sister   . Sleep apnea Brother   . Diabetes Maternal Uncle   . Diabetes Other     Past Medical History  Diagnosis Date  . Hypertension   . Diabetes mellitus without complication   . OSA (obstructive sleep apnea)   . Arthritis   . Migraine   . Anxiety   . Head injury 09/16/12    12 stitches  . Sleep apnea with use of continuous positive airway pressure (CPAP)     sleep study revealed centrals,  adapt SV EPAP at 10 cm water 02-19-11     Past Surgical History  Procedure Laterality Date  . Colon surgery  2010    collapsed colon  . Trigger finger release      x4    Current Outpatient Prescriptions  Medication Sig Dispense Refill  . amLODipine (NORVASC) 5 MG tablet Take 5 mg by mouth daily.      . cephALEXin (KEFLEX) 500 MG capsule Take 1 capsule (500 mg total) by mouth 4 (four) times daily.  28 capsule  0  . CINNAMON PO Take 2 tablets by mouth daily.      . citalopram (CELEXA) 20 MG tablet Take 20 mg by mouth daily.      . cloNIDine (CATAPRES) 0.1 MG tablet Take 0.1 mg by mouth daily as needed (for high BP).      . hydrochlorothiazide (MICROZIDE) 12.5 MG capsule Take 12.5 mg by mouth daily.      [name] ibuprofen (ADVIL,MOTRIN) 200 MG tablet Take 200-400 mg by mouth every 6 (six) hours as needed for pain.      [name] ibuprofen (ADVIL,MOTRIN) 800 MG tablet Take 1 tablet (800 mg total) by mouth 3 (three) times daily.  21 tablet  0  . Insulin Human (INSULIN PUMP) 100 unit/ml SOLN Inject 70 each into the skin continuous. Humalog insulin      . lisinopril (PRINIVIL,ZESTRIL) 20 MG tablet Take 20 mg by mouth daily.      . Multiple Vitamin (MULTIVITAMIN WITH MINERALS) TABS tablet Take 1 tablet by mouth daily.      Bertram Gala Glycol-Propyl Glycol (SYSTANE) 0.4-0.3 % SOLN Apply 2 drops to eye 3 (three) times daily.      . vitamin C (ASCORBIC ACID) 500 MG tablet Take 500 mg by mouth daily.       No current facility-administered medications for this visit.    Allergies as of 09/24/2012 - Review Complete 09/24/2012  Allergen Reaction Noted  . Codeine Nausea And Vomiting 10/27/2011  . Dilaudid [hydromorphone hcl] Anaphylaxis 10/27/2011  . Gluten meal Other (See Comments) 09/16/2012  . Morphine and related Anaphylaxis 10/27/2011  . Toradol [ketorolac tromethamine] Nausea And Vomiting 10/27/2011  . Penicillins Other (See Comments) 10/27/2011    Vitals: BP 148/82  Pulse 83  Resp 18  Ht 6' 3.5"  (1.918 m)  Wt 239 lb (108.41 kg)  BMI 29.47 kg/m2 Last Weight:  Wt Readings from Last 1 Encounters:  09/24/12 239 lb (108.41 kg)   Last Height:   Ht Readings from Last 1 Encounters:  09/24/12 6' 3.5" (1.918 m)  Physical exam:  General: The patient is awake, alert and appears not in acute distress. The patient is well groomed. Head: Normocephalic, facial scar - from a recent accident " while horsing around" . No fall, no faint , no LOC.   Neck is supple. Mallampati 3 , neck circumference: 16.25  Cardiovascular:  Regular rate and rhythm, without  murmurs or carotid bruit, and without distended neck veins. Respiratory: Lungs are clear to auscultation. Skin:  Without evidence of edema, or rash Trunk: BMI is reduced , patient  has normal posture.  Neurologic exam : The patient is awake and alert, oriented to place and time.  Memory subjective  described as impaired , short term memory. There is a normal attention span & concentration ability. Speech is fluent without  dysarthria, dysphonia or aphasia. Mood and affect are appropriate.  Cranial nerves: Pupils are equal and slowed  reactive to light. Funduscopic exam with  pallor , not  Edema. His vision has improved for acuity, no defiiciency in color perception.  Extraocular movements  in vertical and horizontal planes intact and without nystagmus. Visual fields by finger perimetry are intact. Hearing to finger rub intact.  Facial sensation intact to fine touch. Facial motor strength is symmetric and tongue and uvula move midline.  Motor exam:   Normal tone and normal muscle bulk and symmetric normal strength in all extremities. Strong grip .   Sensory:  Fine touch, pinprick and vibration were tested in all extremities.  Unable to feel vibration and filament touch below the ankles bilaterally, skin is shiny , dystrophic over both feet.   Coordination: Rapid alternating movements in the fingers/hands is tested and normal. Finger-to-nose  maneuver tested and noted dysmetria , not  tremor.  Gait and station: Patient walks without assistive device . Strength within normal limits. Steps are unfragmented. Romberg testing is positive l.  Deep tendon reflexes: in the  upper and lower extremities are symmetric and intact. Babinski maneuver response is equivocal .   Assessment:  patient with complex apnea : responding to adapt SV , 4-10 cm water  Bi-modalty , EEP 10  Plan:  Treatment plan and additional workup :continue its use with current mask.  Responding well in reduction of Epworth and in increased, restorative TST. He has a RV in 12 month for compliance and therapy adjustments if necessary. No adjustments today.

## 2012-09-28 ENCOUNTER — Encounter: Payer: Self-pay | Admitting: Neurology

## 2013-02-21 ENCOUNTER — Other Ambulatory Visit: Payer: Self-pay | Admitting: Dermatology

## 2013-08-05 ENCOUNTER — Telehealth: Payer: Self-pay | Admitting: Neurology

## 2013-08-05 NOTE — Telephone Encounter (Signed)
Pt needs a letter of medical necessity to take his Bipap machine on the airplane.  Must be on letterhead and signed by Dr. Vickey Hugerohmeier.  Please rush, flight leaves early next week.

## 2013-08-05 NOTE — Telephone Encounter (Signed)
Daa, please write .

## 2013-09-27 ENCOUNTER — Ambulatory Visit: Payer: Medicare Other | Admitting: Neurology

## 2013-09-28 ENCOUNTER — Ambulatory Visit (INDEPENDENT_AMBULATORY_CARE_PROVIDER_SITE_OTHER): Payer: Medicare Other | Admitting: Neurology

## 2013-09-28 ENCOUNTER — Encounter (INDEPENDENT_AMBULATORY_CARE_PROVIDER_SITE_OTHER): Payer: Self-pay

## 2013-09-28 ENCOUNTER — Encounter: Payer: Self-pay | Admitting: Neurology

## 2013-09-28 VITALS — BP 148/82 | HR 81 | Resp 16 | Ht 76.0 in | Wt 236.0 lb

## 2013-09-28 DIAGNOSIS — R5383 Other fatigue: Secondary | ICD-10-CM

## 2013-09-28 DIAGNOSIS — R5381 Other malaise: Secondary | ICD-10-CM

## 2013-09-28 DIAGNOSIS — G4730 Sleep apnea, unspecified: Secondary | ICD-10-CM

## 2013-09-28 DIAGNOSIS — G4710 Hypersomnia, unspecified: Secondary | ICD-10-CM

## 2013-09-28 NOTE — Progress Notes (Signed)
[facility]  Provider:  Melvyn Novas, M D  Referring Provider: Martha Clan, MD Primary Care Physician:  Martha Clan, MD  Chief Complaint  Patient presents with  . Follow-up    Room 11  . Sleep Apnea    HPI:  [name] is a 63 y.o. [place] born caucasian, married  male -  He  seen here as a  revisit  from Dr. Buren Kos, his internist, for Sleep apnea  BiPAP-follow up. He has plans to visit the country [place] his birth and wants to make sure that BiPAP can be used on flight and at destination. He has a long history of DM type 1 .  Established patient - since 2012,right-handed gentleman, with a history of complex apnea.  Had headaches before treatment with biPAP, his HbA1c have been in the low 6 range. Memory has declined.  Last visit note CD.   He had sleep complains of persistent excessive daytime sleepiness despite nightly use of CPAP associated with restless legs and snoring. The patient also has a long-standing history of type 1 diabetes. A split-night study was performed on 01-22-11 showed evidence of CPAP and was central apneas and the patient was asked to return to use a BiPAP with backup rate or an adaptive serval ventilation device. At the time in beverages doesn't 13 the patient endorsed the Epworth sleepiness score at 17/23 points and the backs inventory at 16 points his BMI was 30.2 his neck circumference 17.5 inches. The patient responded best to adapt SV is an EEP- Expiratory pressure of 10 cm,  and pressure support of 4 cm minimum and 15 cm maximum. He did have problems is continued periodic limb movements at over 60 per hour and associated arousals at about 22 per hour.   Sept 2014 download from his recent machine but showed  residual AHI of 2.6,  thereby complete resolution of his apnea, he is using a minimum pressure support of 4 cm water maximum pressure support of 10 cm water and EEP at 10 cm.  This date had download spans from 07/14/2012 2  today 09/23/2012.  The average time in therapy per day 8 hours and 7 minutes the patient is 100% compliant with its use over 12 month. . His health overall has improved, he meanwhile uses an insulin pump ( 2 month ago) he lost weight, he has lower blood  sugars , Hba1c was 6.4.he lost over 12 pounds on the pump.    His  MMSE in 02-2011 was 29-30 and AFT  22, last Epworth 04-2011 was 14 points.   Nocturia once at Night. Sleep habits are unchanged, no snoring reported by spouse, the patient is going to bed around same time on week days as week hands, for about 8 hours at night.he goes to bed around 10.30 pm ,falls asleep promptly, less than 5 minutes.  Rises at  8 AM , may stay dozing before rising. He has no trouble staying asleep. He likes his machine and he has taken charge of his overall  Health.    Epworth Sept 2014 , 12 points from 14 in April 2013  and from 17 before sleep study in  01-2011 . FSS 30 points, GDS 5 points.    Review of Systems: Out of a complete 14 system review, the patient complains of only the following symptoms, and all other reviewed systems are negative.  nocturia improved, limb movement, RLS neuropathy. His memory issues have been progressing.  Epworth 15  points, FSS 48,  GDS 2 .   History   Social History  . Marital Status: Married    Spouse Name: [name]    Number of Children: 1  . Years of Education: 22   Occupational History  .      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 Smoking status: Never Smoker   . Smokeless tobacco: Never Used  . Alcohol Use: No  . Drug Use: No  . Sexual Activity: Not on file   Other Topics Concern  . Not on file   Social History Narrative   Patient is married Higher education careers adviser) and lives at home with his wife.   Patient has one adult child.   Patient is working part-time and is retired.   Patient has a Probation officer.   Patient is right-handed.   Patient drinks four cups of coffee daily.    Family History  Problem  Relation Age of Onset  . Arthritis Sister   . Sleep apnea Brother   . Diabetes Maternal Uncle   . Diabetes Other     Past Medical History  Diagnosis Date  . Hypertension   . Diabetes mellitus without complication   . OSA (obstructive sleep apnea)   . Arthritis   . Migraine   . Anxiety   . Head injury 09/16/12    12 stitches  . Sleep apnea with use of continuous positive airway pressure (CPAP)     sleep study revealed centrals, adapt SV EPAP at 10 cm water 02-19-11     Past Surgical History  Procedure Laterality Date  . Colon surgery  2010    collapsed colon  . Trigger finger release      x4    Current Outpatient Prescriptions  Medication Sig Dispense Refill  . amLODipine (NORVASC) 5 MG tablet Take 5 mg by mouth daily.      . cephALEXin (KEFLEX) 500 MG capsule Take 1 capsule (500 mg total) by mouth 4 (four) times daily.  28 capsule  0  . CINNAMON PO Take 2 tablets by mouth daily.      . citalopram (CELEXA) 20 MG tablet Take 20 mg by mouth daily.      . cloNIDine (CATAPRES) 0.1 MG tablet Take 0.1 mg by mouth daily as needed (for high BP).      . hydrochlorothiazide (MICROZIDE) 12.5 MG capsule Take 12.5 mg by mouth daily.      [name] ibuprofen (ADVIL,MOTRIN) 200 MG tablet Take 200-400 mg by mouth every 6 (six) hours as needed for pain.      [name] ibuprofen (ADVIL,MOTRIN) 800 MG tablet Take 1 tablet (800 mg total) by mouth 3 (three) times daily.  21 tablet  0  . Insulin Human (INSULIN PUMP) 100 unit/ml SOLN Inject 70 each into the skin continuous. Humalog insulin      . Multiple Vitamin (MULTIVITAMIN WITH MINERALS) TABS tablet Take 1 tablet by mouth daily.      Bertram Gala Glycol-Propyl Glycol (SYSTANE) 0.4-0.3 % SOLN Apply 2 drops to eye 3 (three) times daily.      . vitamin C (ASCORBIC ACID) 500 MG tablet Take 500 mg by mouth daily.       No current facility-administered medications for this visit.    Allergies as of 09/28/2013 - Review Complete 09/28/2013  Allergen Reaction Noted   . Codeine Nausea And Vomiting 10/27/2011  . Dilaudid [hydromorphone hcl] Anaphylaxis 10/27/2011  . Gluten meal Other (See Comments) 09/16/2012  . Morphine  and related Anaphylaxis 10/27/2011  . Toradol [ketorolac tromethamine] Nausea And Vomiting 10/27/2011  . Penicillins Other (See Comments) 10/27/2011    Vitals: BP 148/82  Pulse 81  Resp 16  Ht  (1.93 m)  Wt 236 lb (107.049 kg)  BMI 28.74 kg/m2 Last Weight:  Wt Readings from Last 1 Encounters:  09/28/13 236 lb (107.049 kg)   Last Height:   Ht Readings from Last 1 Encounters:  09/28/13  (1.93 m)    Physical exam:  General: The patient is awake, alert and appears not in acute distress. The patient is well groomed. Head: Normocephalic, facial scar - from a recent accident " while horsing around". No fall, no faint, no LOC.   Neck is supple. Mallampati 3 , neck circumference: 15. 5.  Cardiovascular:  Regular rate and rhythm, without  murmurs or carotid bruit, and without distended neck veins. Respiratory: Lungs are clear to auscultation. Skin:  Without evidence of edema, or rash Trunk: BMI is reduced , patient  has normal posture.  Neurologic exam : The patient is awake and alert, oriented to place and time.   Memory subjective  described as impaired , short term memory primarily- he forgets apoint,ents and parts of conversation.  There is a normal attention span & concentration ability. Speech is fluent without  dysarthria, dysphonia or aphasia. Mood and affect are appropriate.  Cranial nerves: Pupils are equal and slowed  reactive to light. Funduscopic exam with  pallor , not  Edema. His vision has improved for acuity, no defiiciency in color perception.  Extraocular movements  in vertical and horizontal planes intact and without nystagmus. Visual fields by finger perimetry are intact. Hearing to finger rub intact.  Facial sensation intact to fine touch. Facial motor strength is symmetric and tongue and uvula move  midline.  Motor exam:   Normal tone and normal muscle bulk and symmetric, attenuated -  He has shoulder movement limitations. ROM.   normal strength in all extremities.  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  .   Sensory:  Fine touch, pinprick and vibration were tested in all extremities.   Unable to feel vibration and filament touch below the ankles bilaterally, skin is shiny , dystrophic over both feet.    Coordination: Rapid alternating movements in the fingers/hands is tested and normal. Finger-to-nose maneuver tested and noted dysmetria , not  tremor.  Gait and station: Patient walks without assistive device . Strength within normal limits. Steps are unfragmented. Romberg testing is positive !  Deep tendon reflexes: in the  upper and lower extremities are attenauted, symmetric and intact. Babinski maneuver response is equivocal .   Assessment:   1) patient with complex apnea : responding to adapt SV , 4-10 cm water  Bi-modalty , EPAP 10 The patient's residual AHI is 3.6 which is close to ideal. This average time of using the machine date is 7 hours 46 minutes he is 98% compliant I CMS criteria minimal pressure support still 4 cm maximum pressure support 10 cm water and EPAP of 10 cm water he does have a high leak- but it seems not to diminish the therapy  goal to reduce  his AHI.  Very  good result unfortunately this does not reflect in a lower fatigue or loss sleepiness level. 2) MMSE impairment for short word recall, 1 of 3 recalled. Full time, date and place,and repetition. Math and spelling. 28-30.  Not dementia . Mild cognitive impairment sec. to diabetes?  Plan:  Treatment plan and additional workup :continue its use with current mask.  He has a RV in 12 month for BiPAP  compliance and therapy adjustments,  if necessary. No adjustments today.  MMSE / MOCA next visit, naming is a problem.

## 2013-09-28 NOTE — Patient Instructions (Signed)
Mild Neurocognitive Disorder  Mild neurocognitive disorder (formerly known as mild cognitive impairment) is a mental disorder. It is a slight abnormal decrease in mental function. The areas of mental function affected may include memory, thought, communication, behavior, and completion of tasks. The decrease is noticeable and measurable but for the most part does not interfere with your daily activities.  Mild neurocognitive disorder typically occurs in people older than 60 years but can occur earlier. It is not as serious as major neurocognitive disorder (formerly known as dementia) but may lead to a more serious neurocognitive disorder. However, in some cases the condition does not get worse. A few people with this disorder even improve.  CAUSES   There are a number of different causes of mild neurocognitive disorder:   · Brain disorders associated with abnormal protein deposits, such as Alzheimer's disease, Pick's disease, and Lewy body disease.  · Brain disorders associated with abnormal movement, such as Parkinson's disease and Huntington's disease.  · Diseases affecting blood vessels in the brain and resulting in mini-strokes.  · Certain infections, such as human immunodeficiency virus (HIV) infection.  · Traumatic brain injury.  · Other medical conditions such as brain tumors, underactive thyroid (hypothyroidism), and vitamin B12 deficiency.  · Use of certain prescription medicine and "recreational" drugs.  SYMPTOMS   Symptoms of mild neurocognitive disorder include:  · Difficulty remembering. You may forget details of recent events, names, or phone numbers. You may forget important social events and appointments or repeatedly forget where you put your car keys.   · Difficulty thinking and solving problems. You may have trouble with complex tasks such as paying bills or driving in unfamiliar locations.   · Difficulty communicating. You may have trouble finding the right word, naming an object, forming a  sentence that makes sense, or understanding what you read or hear.  · Changes in your behavior or personality. You may lose interest in the things that you used to enjoy or withdraw from social situations. You may get angry more easily than usual. You may act before thinking. You may do things in public that you would not usually do. You may hear or see things that are not real (hallucinations). You may believe falsely that others are trying to hurt you (paranoia).  DIAGNOSIS  Mild neurocognitive disorder is diagnosed through an assessment by your health care provider. Your health care provider will ask you and your family, friends, or coworkers questions about your symptoms. He or she will ask how often the symptoms occur, how long they have been occurring, whether they are getting worse, and the effect they are having on your life. Your health care provider may refer you to a neurologist or mental health specialist for a detailed evaluation of your mental functions (neuropsychological testing).   To identify the cause of your mild neurocognitive disorder, your health care provider may:  · Obtain a detailed medical history.  · Ask about alcohol and drug use, including prescription medicine.  · Perform a physical exam.  · Order blood tests and brain imaging exams.  TREATMENT   Mild neurocognitive disorder caused by infections, use of certain medicines or "recreational" drugs, and certain medical conditions may improve with treatment of the condition that is causing the disorder.  Mild neurocognitive disorder resulting from other causes generally does not improve and may worsen. In these cases, the goal of treatment is to slow progression of the disorder and help you cope with the loss of mental function. Treatments in these cases   include:   · Medicine. Medicine helps mainly with memory loss and behavioral symptoms.    · Talk therapy. Talk therapy provides education, emotional support, memory aids, and other ways of  making up for decreases in mental function.       · Lifestyle changes. These include regular exercise, a healthy diet (including essential omega-3 fatty acids), intellectual stimulation, and increased social interaction.  Document Released: 08/25/2012 Document Revised: 05/09/2013 Document Reviewed: 08/25/2012  [employer]® Patient Information ©2015 [employer]. This information is not intended to replace advice given to you by your health care provider. Make sure you discuss any questions you have with your health care provider.

## 2013-10-07 ENCOUNTER — Telehealth: Payer: Self-pay | Admitting: Neurology

## 2013-10-07 NOTE — Telephone Encounter (Signed)
Patient tested positive for narcolepsy by HLA testing.  I called his mobile and he is travelling to [place] right now, will need a RV in November after his return to  discuss treatment options. . CD

## 2013-10-10 NOTE — Telephone Encounter (Signed)
Sandy; Please write an appointment for mid- November on the books, and send him a card to inform /request response . CD

## 2013-10-17 NOTE — Telephone Encounter (Signed)
Patient is on a cruise. He is not able to schedule appointment over phone due to roaming charges. Per Dr. Algis Downs wishes we will mail appointment card and patient can reschedule if needed.

## 2013-11-23 ENCOUNTER — Ambulatory Visit (INDEPENDENT_AMBULATORY_CARE_PROVIDER_SITE_OTHER): Payer: Medicare Other | Admitting: Neurology

## 2013-11-23 ENCOUNTER — Encounter: Payer: Self-pay | Admitting: Neurology

## 2013-11-23 VITALS — BP 165/84 | HR 94 | Temp 98.4°F | Resp 14 | Ht 76.0 in | Wt 234.0 lb

## 2013-11-23 DIAGNOSIS — G47419 Narcolepsy without cataplexy: Secondary | ICD-10-CM

## 2013-11-23 HISTORY — DX: Narcolepsy without cataplexy: G47.419

## 2013-11-23 NOTE — Progress Notes (Signed)
[facility]  Provider:  Larey Seat, M D  Referring Provider: Marton Redwood, MD Primary Care Physician:  Marton Redwood, MD  Chief Complaint  Patient presents with  . RV sleep cpap    Rm 11, alone    HPI:  [name] is a 63 y.o. [place] born caucasian, married  male -  He  seen here as a  revisit  from Dr. Lutricia Feil, his internist, for Sleep apnea  BiPAP-follow up. He has plans to visit the country of his birth and wants to make sure that BiPAP can be used on flight and at destination. He has a long history of DM type 1 .  Established patient - since 2012, right-handed married  gentleman, with a history of complex apnea.  Had headaches before treatment with BiPAP, his HbA1c have been in the low 6 range. Memory has declined. His high fatigue and Epworth scores remained high after BiPAP. He was tested for narcolepsy by HLA: positive ! He just left for a journey to [place] when we got these result, and we meet now after his return. He endorses today Epworth at 9, feels rested and restored. He feels that he can drive and is alert for hours, no sleep attacks.  Does he need medications ? No, or not yet. Explained the genetic predisposition and autoimmune barrier to narcolepsy.   Memory testing [facility] :  Liane Comber, RN.     Last visit note CD.   He had sleep complains of persistent excessive daytime sleepiness despite nightly use of CPAP associated with restless legs and snoring. The patient also has a long-standing history of type 1 diabetes. A split-night study was performed on 01-22-11 showed evidence of CPAP and was central apneas and the patient was asked to return to use a BiPAP with backup rate or an adaptive serval ventilation device. At the time in beverages doesn't 13 the patient endorsed the Epworth sleepiness score at 17/23 points and the backs inventory at 16 points his BMI was 30.2 his neck circumference 17.5 inches. The patient responded best to adapt  SV is an EEP- Expiratory pressure of 10 cm,  and pressure support of 4 cm minimum and 15 cm maximum. He did have problems is continued periodic limb movements at over 60 per hour and associated arousals at about 22 per hour. Sept 2014 download from his recent machine but showed residual AHI of 2.6,  thereby complete resolution of his apnea, he is using a minimum pressure support of 4 cm water maximum pressure support of 10 cm water and EEP at 10 cm.   Epworth 15 points, FSS 48,  GDS 2 .   History   Social History  . Marital Status: Married    Spouse Name: [name]    Number of Children: 1  . Years of Education: 22   Occupational History  .      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 Smoking status: Never Smoker   . Smokeless tobacco: Never Used  . Alcohol Use: No  . Drug Use: No  . Sexual Activity: Not on file   Other Topics Concern  . 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   Patient has one adult child.   Patient is working part-time and is retired.   Patient has a Haematologist.   Patient is right-handed.   Patient drinks  four cups of coffee daily.    Family History  Problem Relation Age of Onset  . Arthritis Sister   . Sleep apnea Brother   . Diabetes Maternal Uncle   . Diabetes Other   . Heart failure Mother     Past Medical History  Diagnosis Date  . Hypertension   . Diabetes mellitus without complication   . OSA (obstructive sleep apnea)   . Arthritis   . Migraine   . Anxiety   . Head injury 09/16/12    12 stitches  . Sleep apnea with use of continuous positive airway pressure (CPAP)     sleep study revealed centrals, adapt SV EPAP at 10 cm water 02-19-11   . OSA treated with BiPAP     Past Surgical History  Procedure Laterality Date  . Colon surgery  2010    collapsed colon  . Trigger finger release      x4    Current Outpatient Prescriptions  Medication Sig Dispense  Refill  . amLODipine (NORVASC) 5 MG tablet Take 5 mg by mouth daily.    . cephALEXin (KEFLEX) 500 MG capsule Take 1 capsule (500 mg total) by mouth 4 (four) times daily. 28 capsule 0  . CINNAMON PO Take 2 tablets by mouth daily.    . citalopram (CELEXA) 20 MG tablet Take 20 mg by mouth daily.    . cloNIDine (CATAPRES) 0.1 MG tablet Take 0.1 mg by mouth daily as needed (for high BP).    . hydrochlorothiazide (MICROZIDE) 12.5 MG capsule Take 25 mg by mouth daily.     [name] ibuprofen (ADVIL,MOTRIN) 200 MG tablet Take 200-400 mg by mouth every 6 (six) hours as needed for pain.    [name] ibuprofen (ADVIL,MOTRIN) 800 MG tablet Take 1 tablet (800 mg total) by mouth 3 (three) times daily. 21 tablet 0  . Insulin Human (INSULIN PUMP) 100 unit/ml SOLN Inject 70 each into the skin continuous. Humalog insulin    . Multiple Vitamin (MULTIVITAMIN WITH MINERALS) TABS tablet Take 1 tablet by mouth daily.    Vladimir Faster Glycol-Propyl Glycol (SYSTANE) 0.4-0.3 % SOLN Apply 2 drops to eye 3 (three) times daily.    . vitamin C (ASCORBIC ACID) 500 MG tablet Take 500 mg by mouth daily.     No current facility-administered medications for this visit.    Allergies as of 11/23/2013 - Review Complete 11/23/2013  Allergen Reaction Noted  . Codeine Nausea And Vomiting 10/27/2011  . Dilaudid [hydromorphone hcl] Anaphylaxis 10/27/2011  . Gluten meal Other (See Comments) 09/16/2012  . Morphine and related Anaphylaxis 10/27/2011  . Toradol [ketorolac tromethamine] Nausea And Vomiting 10/27/2011  . Penicillins Other (See Comments) 10/27/2011    Vitals: BP 165/84 mmHg  Pulse 94  Temp(Src) 98.4 F (36.9 C) (Oral)  Resp 14  Ht 6' 4"  (1.93 m)  Wt 234 lb (106.142 kg)  BMI 28.50 kg/m2 Last Weight:  Wt Readings from Last 1 Encounters:  11/23/13 234 lb (106.142 kg)   Last Height:   Ht Readings from Last 1 Encounters:  11/23/13 6' 4"  (1.93 m)    Physical exam:  General: The patient is awake, alert and appears not in acute  distress. The patient is well groomed. Head: Normocephalic, facial scar - from a recent accident " while horsing around". No fall, no faint, no LOC.   Neck is supple. Mallampati 3 , neck circumference: 15. 5.  Cardiovascular:  Regular rate and rhythm, without  murmurs or carotid bruit, and without distended  neck veins. Respiratory: Lungs are clear to auscultation. Skin:  Without evidence of edema, or rash Trunk: BMI is reduced , patient  has normal posture.  Neurologic exam : The patient is awake and alert, oriented to place and time.   Memory subjective  described as impaired , short term memory primarily- he forgets apoint,ents and parts of conversation.  There is a normal attention span & concentration ability. Speech is fluent without  dysarthria, dysphonia or aphasia. Mood and affect are appropriate.  Cranial nerves: Pupils are equal and slowed  reactive to light. Funduscopic exam with  pallor , not  Edema. His vision has improved for acuity, no defiiciency in color perception.  Extraocular movements  in vertical and horizontal planes intact and without nystagmus. Visual fields by finger perimetry are intact. Hearing to finger rub intact.  Facial sensation intact to fine touch. Facial motor strength is symmetric and tongue and uvula move midline.  Motor exam:   Normal tone and normal muscle bulk and symmetric, attenuated -  He has shoulder movement limitations. ROM.   normal strength in all extremities.  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  .   Sensory:  Fine touch, pinprick and vibration were tested in all extremities.   Unable to feel vibration and filament touch below the ankles bilaterally, skin is shiny , dystrophic over both feet.    Coordination: Rapid alternating movements in the fingers/hands is tested and normal. Finger-to-nose maneuver tested and noted dysmetria , not  tremor.  Gait and station: Patient walks without assistive device . Strength within  normal limits. Steps are unfragmented. Romberg testing is positive !  Deep tendon reflexes: in the  upper and lower extremities are attenauted, symmetric and intact. Babinski maneuver response is equivocal .   Assessment:   1) patient with complex apnea  And HLA positive narcolepsy -  2) MMSE impairment for short word recall, 1 of 3 recalled.    Full time, date and place,and repetition. Math and spelling.  27 -30 , failed trail making test and 2 words in recall.  (Last time in September  2015 at 28-30.)   Not dementia . Mild cognitive impairment sec. to diabetes?       Plan:  Treatment plan and additional workup :continue its use with current mask.  He has a RV in 9 month for BiPAP  compliance and therapy adjustments, if necessary. No adjustments today.  May see Np.  MMSE / [place] next visit, naming is a problem.

## 2013-11-23 NOTE — Addendum Note (Signed)
Addended by: Melvyn NovasHMEIER, Allanna Bresee on: 11/23/2013 02:16 PM   Modules accepted: Level of Service

## 2013-11-25 ENCOUNTER — Ambulatory Visit: Payer: Self-pay | Admitting: Neurology

## 2014-02-23 IMAGING — CT CT HEAD W/O CM
2 series · 17 of 30 positions shown, 20 images · non-contrast
Comparison: July 05, 2005.

CLINICAL DATA: Headache, head injury after fall.

CT HEAD WITHOUT CONTRAST
TECHNIQUE: Contiguous axial images were obtained from the base of
the skull through the vertex without contrast.

[Series 2: head w/o · axial · non-contrast · 0.48mm/px · z∈[-108,+12]mm · 9 of 30 slices shown, 12 images]
[im 3/30  brain]
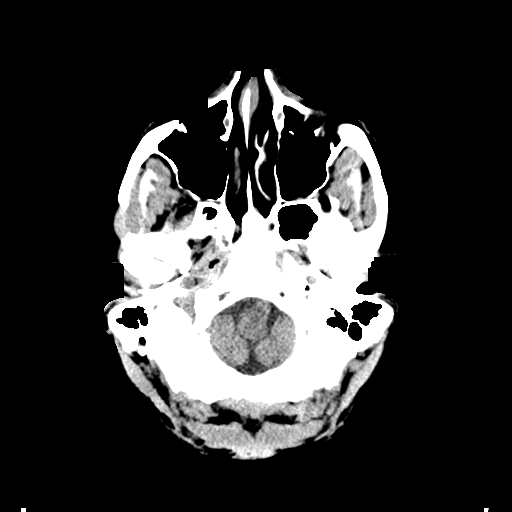
[im 3/30  bone]
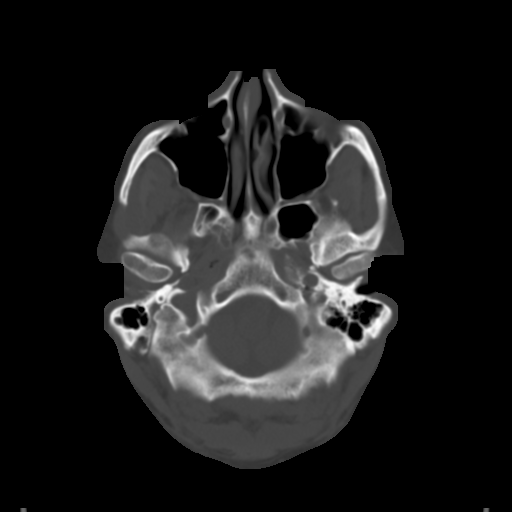
[im 6/30  brain]
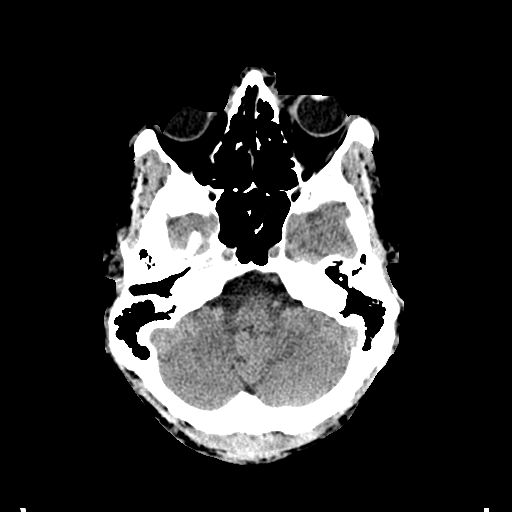
[im 9/30  brain]
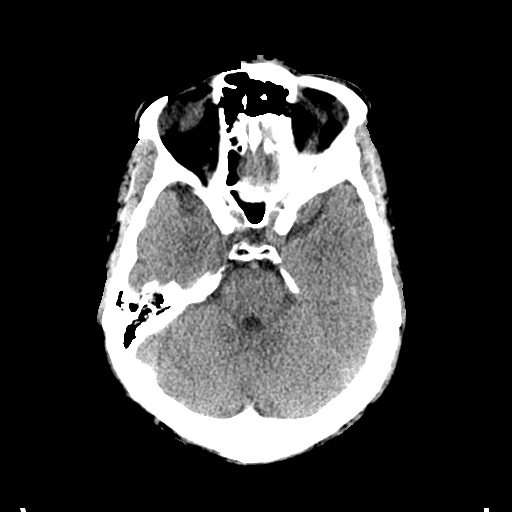
[im 12/30  brain]
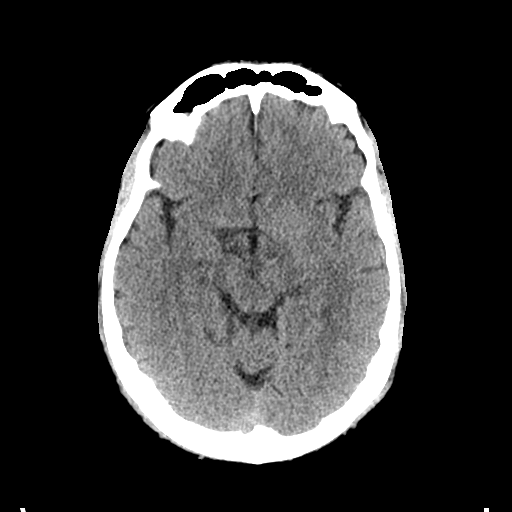
[im 15/30  brain]
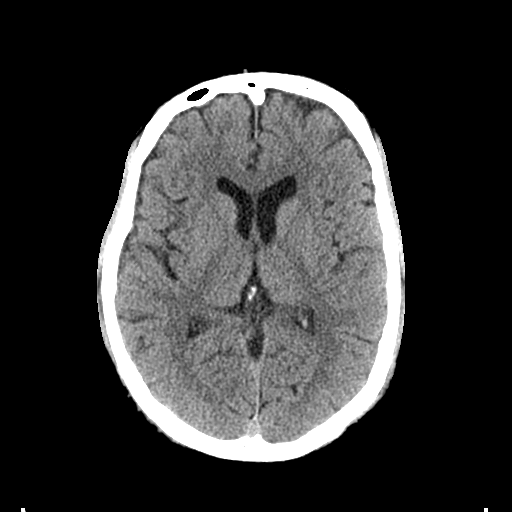
[im 15/30  bone]
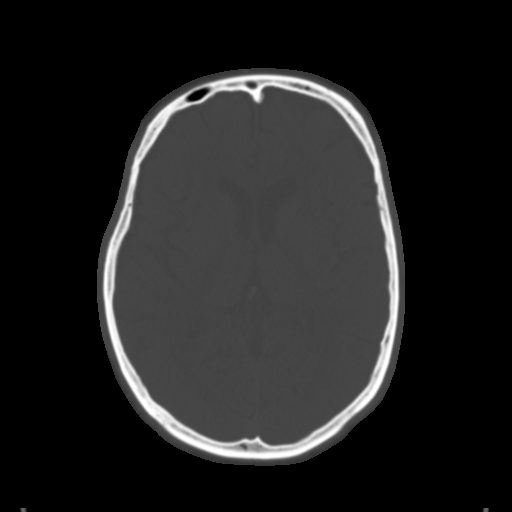
[im 18/30  brain]
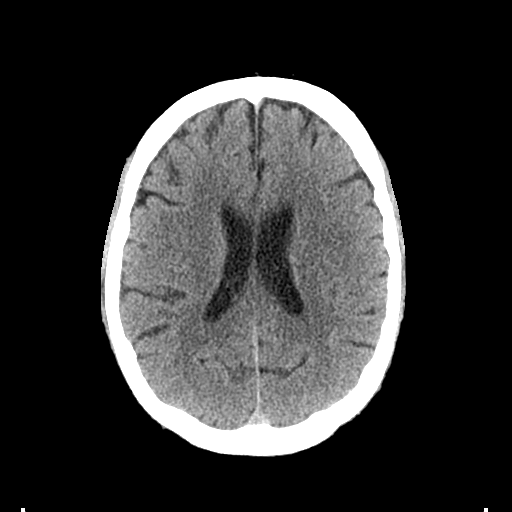
[im 21/30  brain]
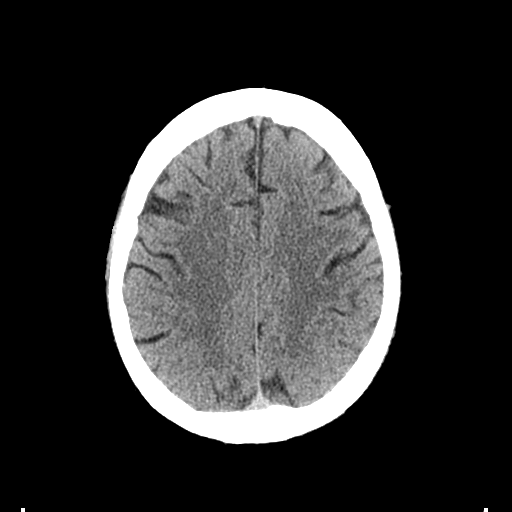
[im 24/30  brain]
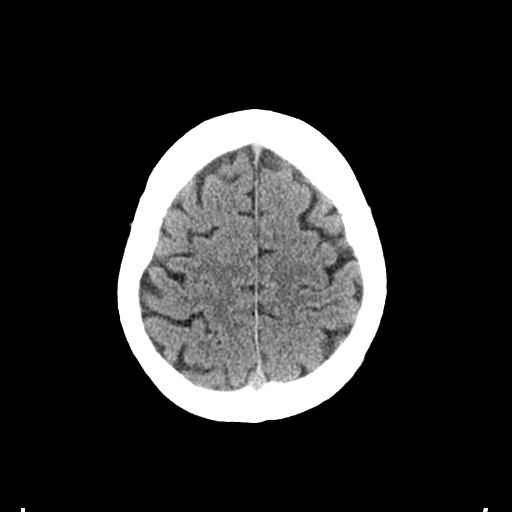
[im 27/30  brain]
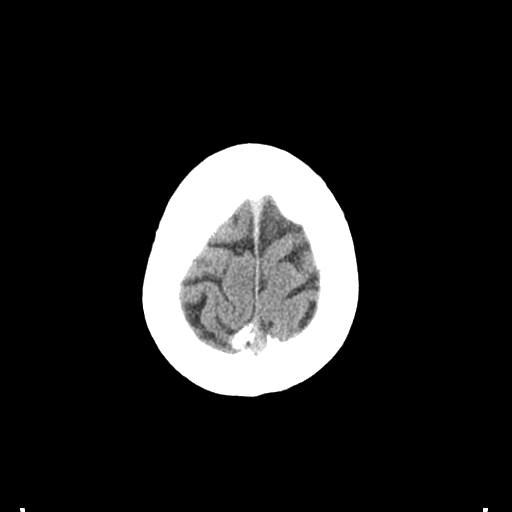
[im 27/30  bone]
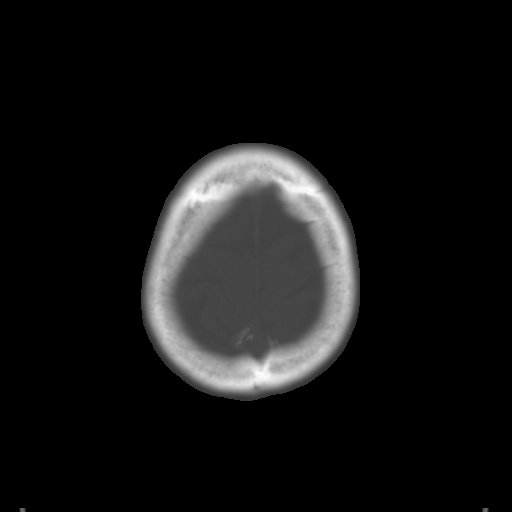

[Series 3: bone windows · axial · 0.48mm/px · z∈[-104,+10]mm · 8 of 50 slices shown]
[im 6/50  bone]
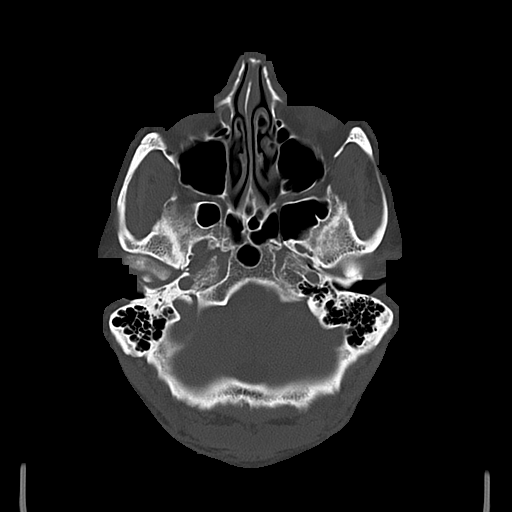
[im 11/50  bone]
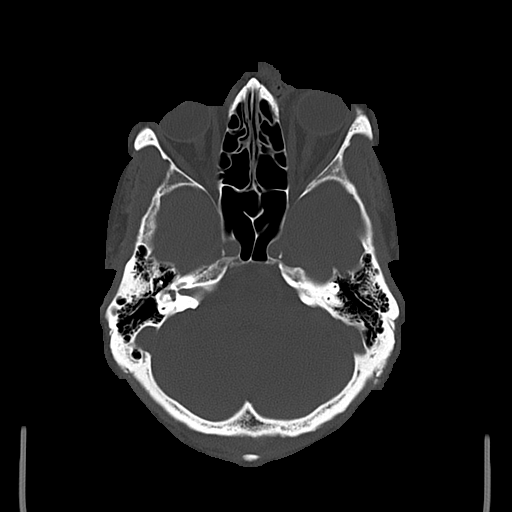
[im 17/50  bone]
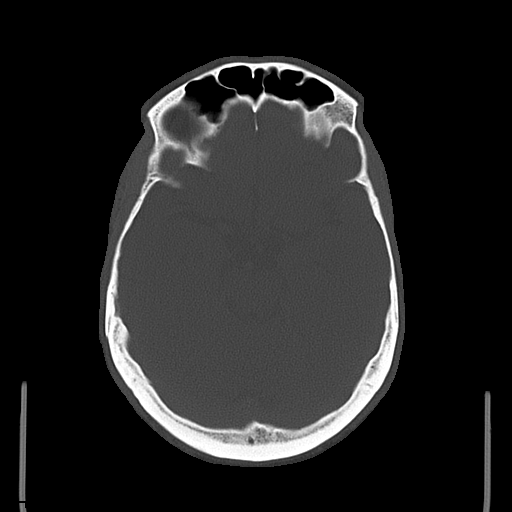
[im 22/50  bone]
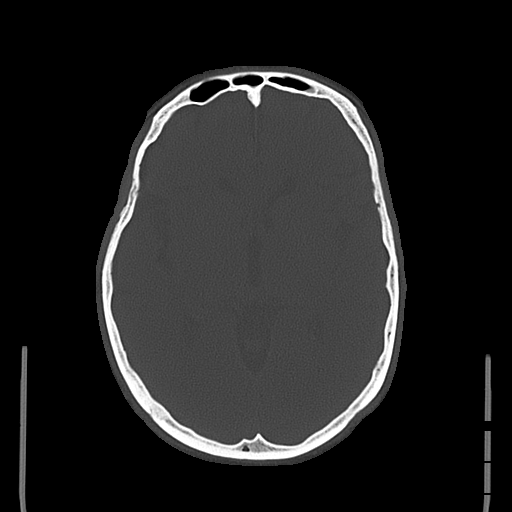
[im 28/50  bone]
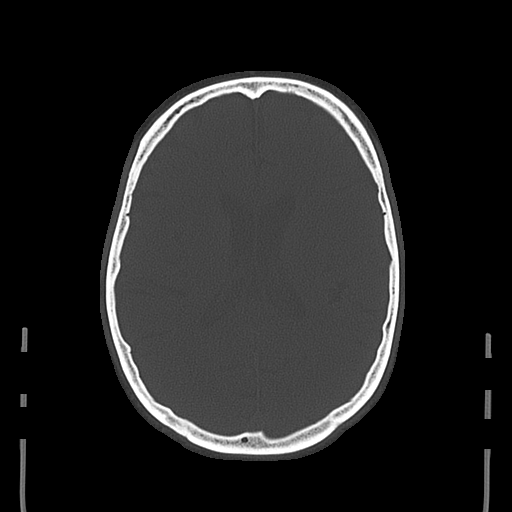
[im 33/50  bone]
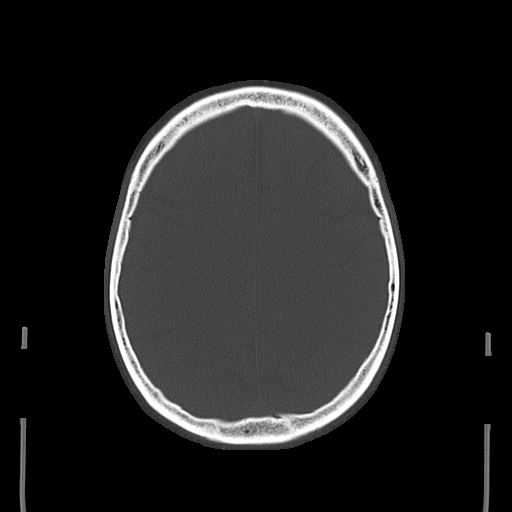
[im 39/50  bone]
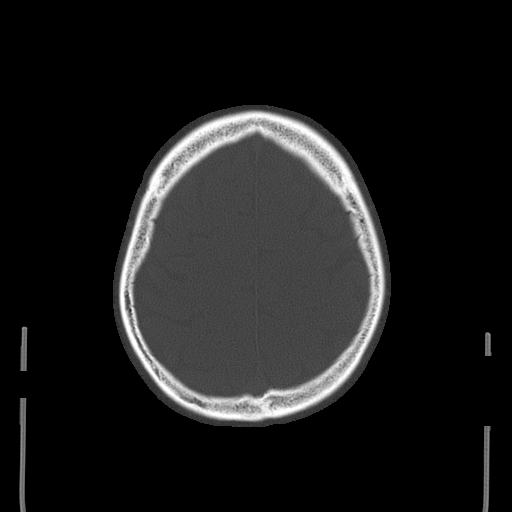
[im 44/50  bone]
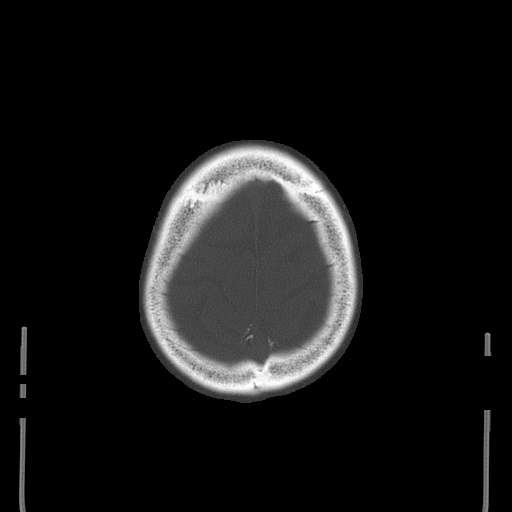

[17 of 30 positions shown; findings below may reference images not displayed]

FINDINGS: Soft tissue laceration is seen involving the frontal soft
tissues of the scalp.  Bony calvarium appears intact. No mass
effect or midline shift is noted.  Ventricular size is within
normal limits.  There is no evidence of mass lesion, hemorrhage or
acute infarction.
IMPRESSION: No acute intracranial abnormality seen.  Frontal scalp laceration
is noted.

## 2014-05-30 ENCOUNTER — Ambulatory Visit: Payer: Medicare Other | Admitting: Neurology

## 2014-07-24 ENCOUNTER — Ambulatory Visit (INDEPENDENT_AMBULATORY_CARE_PROVIDER_SITE_OTHER): Payer: Medicare Other | Admitting: Adult Health

## 2014-07-24 ENCOUNTER — Encounter: Payer: Self-pay | Admitting: Adult Health

## 2014-07-24 VITALS — BP 165/88 | HR 80 | Ht 76.0 in | Wt 242.0 lb

## 2014-07-24 DIAGNOSIS — G47419 Narcolepsy without cataplexy: Secondary | ICD-10-CM

## 2014-07-24 DIAGNOSIS — G4733 Obstructive sleep apnea (adult) (pediatric): Secondary | ICD-10-CM

## 2014-07-24 NOTE — Progress Notes (Signed)
I agree with the assessment and plan as directed by NP .The patient is known to me .   Braylea Brancato, MD  

## 2014-07-24 NOTE — Progress Notes (Signed)
PATIENT: [name] DOB: 1950-04-03  REASON FOR VISIT: follow up- sleep apnea on Bipap HISTORY FROM: patient  HISTORY OF PRESENT ILLNESS:  [name] is a 64 year old male with a history of obstructive sleep apnea on BiPAP. He was referred to our office by Dr. Brigitte Pulse. He returns today for a compliance download. His download indicates that he uses his machine 30 out of 30 days for compliance of 100%. He uses machine for greater than 4 hours 30/30 days for compliance of 100%. On average he uses his machine 8 hours and 17 minutes. His minimum pressure is 4 cm of water and maximum pressure is 10 cm of water the patient's AHI is 5.4. He has a leak in the 95th percentile at 45.2 L/min. He does state that his mask tends to slide off at night due to his straps. His Epworth sleepiness score is 11 was previously 9 his fatigue severity score is 44 was previously 48. Patient feels that his daytime sleepiness has remained the same. He states that he goes to bed around midnight and arises at 8 AM. He rarely has to get up to urinate during the night. Denies any trouble falling or staying asleep. Denies any new medical issues. He returns today for an evaluation.  REVIEW OF SYSTEMS: Out of a complete 14 system review of symptoms, the patient complains only of the following symptoms, and all other reviewed systems are negative.  Restless leg, apnea, back pain, joint pain, aching muscles, neck pain, neck stiffness, itching, memory loss, headache, speech difficulty, food allergies   ALLERGIES: Allergies  Allergen Reactions  . Codeine Nausea And Vomiting    Last time, came to ED  . Dilaudid [Hydromorphone Hcl] Anaphylaxis    Patient goes into cardiac arrest  . Gluten Meal Other (See Comments)    Severe migraines  . Morphine And Related Anaphylaxis  . Toradol [Ketorolac Tromethamine] Nausea And Vomiting  . Penicillins Other (See Comments)    Unknown     HOME MEDICATIONS: Outpatient Prescriptions  Prior to Visit  Medication Sig Dispense Refill  . amLODipine (NORVASC) 5 MG tablet Take 5 mg by mouth daily.    [name] CINNAMON PO Take 2 tablets by mouth daily.    . citalopram (CELEXA) 20 MG tablet Take 20 mg by mouth daily.    . hydrochlorothiazide (MICROZIDE) 12.5 MG capsule Take 25 mg by mouth daily.     [name] ibuprofen (ADVIL,MOTRIN) 200 MG tablet Take 200-400 mg by mouth every 6 (six) hours as needed for pain.    [name] ibuprofen (ADVIL,MOTRIN) 800 MG tablet Take 1 tablet (800 mg total) by mouth 3 (three) times daily. 21 tablet 0  . Insulin Human (INSULIN PUMP) 100 unit/ml SOLN Inject 70 each into the skin continuous. Humalog insulin    . Multiple Vitamin (MULTIVITAMIN WITH MINERALS) TABS tablet Take 1 tablet by mouth daily.    . vitamin C (ASCORBIC ACID) 500 MG tablet Take 500 mg by mouth daily.    . cloNIDine (CATAPRES) 0.1 MG tablet Take 0.1 mg by mouth daily as needed (for high BP).    Vladimir Faster Glycol-Propyl Glycol (SYSTANE) 0.4-0.3 % SOLN Apply 2 drops to eye 3 (three) times daily.    . cephALEXin (KEFLEX) 500 MG capsule Take 1 capsule (500 mg total) by mouth 4 (four) times daily. (Patient not taking: Reported on 07/24/2014) 28 capsule 0   No facility-administered medications prior to visit.    PAST MEDICAL HISTORY: Past Medical History  Diagnosis  Date  . Hypertension   . Diabetes mellitus without complication   . OSA (obstructive sleep apnea)   . Arthritis   . Migraine   . Anxiety   . Head injury 09/16/12    12 stitches  . Sleep apnea with use of continuous positive airway pressure (CPAP)     sleep study revealed centrals, adapt SV EPAP at 10 cm water 02-19-11   . OSA treated with BiPAP   . 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 Colon surgery  2010    collapsed colon  . Trigger finger release      x4    FAMILY HISTORY: Family  History  Problem Relation Age of Onset  . Arthritis Sister   . Sleep apnea Brother   . Diabetes Maternal Uncle   . Diabetes Other   . Heart failure Mother     SOCIAL HISTORY: History   Social History  . Marital Status: Married    Spouse Name: [name]  . Number of Children: 1  . Years of Education: 22   Occupational History  .      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 Smoking status: Never Smoker   . Smokeless tobacco: Never Used  . Alcohol Use: No  . Drug Use: No  . Sexual Activity: Not on file   Other Topics Concern  . 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   Patient has one adult child.   Patient is working part-time and is retired.   Patient has a Haematologist.   Patient is right-handed.   Patient drinks four cups of coffee daily.      PHYSICAL EXAM  Filed Vitals:   07/24/14 0923  BP: 165/88  Pulse: 80  Height: $Remove'6\' 4"'GHEjYyS$  (1.93 m)  Weight: 242 lb (109.77 kg)   Body mass index is 29.47 kg/(m^2).  Generalized: Well developed, in no acute distress  Neck: Circumference 17-1/2 inches, Mallampati 3+  Neurological examination  Mentation: Alert oriented to time, place, history taking. Follows all commands speech and language fluent Cranial nerve II-XII: Pupils were equal round reactive to light. Extraocular movements were full, visual field were full on confrontational test. Facial sensation and strength were normal. Uvula tongue midline. Head turning and shoulder shrug  were normal and symmetric. Motor: The motor testing reveals 5 over 5 strength of all 4 extremities. Good symmetric motor tone is noted throughout.  Sensory: Sensory testing is intact to soft touch on all 4 extremities. No evidence of extinction is noted.  Coordination: Cerebellar testing reveals good finger-nose-finger and heel-to-shin bilaterally.  Gait and station: Gait is normal. Tandem gait is normal. Romberg is  negative. No drift is seen.  Reflexes: Deep tendon reflexes are symmetric and normal bilaterally.   DIAGNOSTIC DATA (LABS, IMAGING, TESTING) - I reviewed patient records, labs, notes, testing and imaging myself where available.     ASSESSMENT AND PLAN 64 y.o. 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 here with:  1. Obstructive sleep apnea on BiPAP 2.  Narcolepsy  Overall the patient is doing well. His compliance download is excellent. The patient does have a leak but the patient has not changed his mask or straps recently. I will provide the patient was in order to have his supplies replaced. Patient feels that his daytime sleepiness has remained the same. Patient advised that if his symptoms worsen or he develops new symptoms he should let us know. Otherwise he will follow-up in 6 months or sooner if needed.     Ward Givens, MSN, NP-C 07/24/2014, 9:42 AM [facility] [address], [place], [place] [address] [phone]  Note: This document was prepared with digital dictation and possible smart phrase technology. Any transcriptional errors that result from this process are unintentional.

## 2015-01-24 ENCOUNTER — Ambulatory Visit: Payer: Medicare Other | Admitting: Adult Health

## 2015-02-13 ENCOUNTER — Encounter: Payer: Self-pay | Admitting: Adult Health

## 2015-02-13 ENCOUNTER — Ambulatory Visit (INDEPENDENT_AMBULATORY_CARE_PROVIDER_SITE_OTHER): Payer: Medicare Other | Admitting: Adult Health

## 2015-02-13 DIAGNOSIS — G4733 Obstructive sleep apnea (adult) (pediatric): Secondary | ICD-10-CM

## 2015-02-13 NOTE — Patient Instructions (Signed)
You will get a call from DME in regards to supplies Continue using cpap nightly If your symptoms worsen or you develop new symptoms please let us know.

## 2015-02-13 NOTE — Progress Notes (Signed)
PATIENT: [name] DOB: September 07, 1950  REASON FOR VISIT: follow up- OSA  HISTORY FROM: patient  HISTORY OF PRESENT ILLNESS: [name] is a 65 year old male with a history of obstructive sleep apnea on BiPAP. He returns today for follow-up. His compliance download indicates that he uses machine 30 out of 30 days for a compliance of 100%. He uses machine greater than 4 hours each night. His residual AHI is 4.1 on a minimum pressure of 4 cm and maximum pressure of 10 cm with EPAP of 10 cm of water. The patient continues to have a significant leak with 49.8 L/m and the 95th percentile. At the last visit the patient was instructed to get new supplies however he states that his DME company never sent those. He states that often during the night his mask will slip off. He feels that his straps no longer fit. He states that sometimes his mass will come off every 2 hours. The patient typically goes to bed around midnight and arises at 8 AM. He states that he feels fatigued during the day he will take a nap. His Epworth sleepiness score is 13 and fatigue severity score is 49. He returns today for an evaluation.  HISTORY 07/24/14: [name] is a 65 year old male with a history of obstructive sleep apnea on BiPAP. He was referred to our office by Dr. Brigitte Pulse. He returns today for a compliance download. His download indicates that he uses his machine 30 out of 30 days for compliance of 100%. He uses machine for greater than 4 hours 30/30 days for compliance of 100%. On average he uses his machine 8 hours and 17 minutes. His minimum pressure is 4 cm of water and maximum pressure is 10 cm of water the patient's AHI is 5.4. He has a leak in the 95th percentile at 45.2 L/min. He does state that his mask tends to slide off at night due to his straps. His Epworth sleepiness score is 11 was previously 9 his fatigue severity score is 44 was previously 48. Patient feels that his daytime sleepiness has remained the same. He  states that he goes to bed around midnight and arises at 8 AM. He rarely has to get up to urinate during the night. Denies any trouble falling or staying asleep. Denies any new medical issues. He returns today for an evaluation.  REVIEW OF SYSTEMS: Out of a complete 14 system review of symptoms, the patient complains only of the following symptoms, and all other reviewed systems are negative.  Food allergies, memory loss, joint pain, aching muscles, muscle cramps, neck pain, neck stiffness, rash, itching, restless leg, snoring  ALLERGIES: Allergies  Allergen Reactions  . Codeine Nausea And Vomiting    Last time, came to ED  . Dilaudid [Hydromorphone Hcl] Anaphylaxis    Patient goes into cardiac arrest  . Gluten Meal Other (See Comments)    Severe migraines  . Morphine And Related Anaphylaxis  . Toradol [Ketorolac Tromethamine] Nausea And Vomiting  . Penicillins Other (See Comments)    Unknown     HOME MEDICATIONS: Outpatient Prescriptions Prior to Visit  Medication Sig Dispense Refill  . amLODipine (NORVASC) 5 MG tablet Take 5 mg by mouth daily.    [name] CINNAMON PO Take 2 tablets by mouth daily.    . citalopram (CELEXA) 20 MG tablet Take 20 mg by mouth daily.    . cloNIDine (CATAPRES) 0.1 MG tablet Take 0.1 mg by mouth daily as needed (for high BP).    [name]  hydrochlorothiazide (MICROZIDE) 12.5 MG capsule Take 25 mg by mouth daily.     [name] ibuprofen (ADVIL,MOTRIN) 200 MG tablet Take 200-400 mg by mouth every 6 (six) hours as needed for pain.    . Insulin Human (INSULIN PUMP) 100 unit/ml SOLN Inject 70 each into the skin continuous. Humalog insulin    . Multiple Vitamin (MULTIVITAMIN WITH MINERALS) TABS tablet Take 1 tablet by mouth daily.    Vladimir Faster Glycol-Propyl Glycol (SYSTANE) 0.4-0.3 % SOLN Apply 2 drops to eye 3 (three) times daily.    . vitamin C (ASCORBIC ACID) 500 MG tablet Take 500 mg by mouth daily.    [name] ibuprofen (ADVIL,MOTRIN) 800 MG tablet Take 1 tablet (800 mg total) by  mouth 3 (three) times daily. (Patient not taking: Reported on 02/13/2015) 21 tablet 0   No facility-administered medications prior to visit.    PAST MEDICAL HISTORY: Past Medical History  Diagnosis Date  . Hypertension   . Diabetes mellitus without complication ([place])   . OSA (obstructive sleep apnea)   . Arthritis   . Migraine   . Anxiety   . Head injury 09/16/12    12 stitches  . Sleep apnea with use of continuous positive airway pressure (CPAP)     sleep study revealed centrals, adapt SV EPAP at 10 cm water 02-19-11   . OSA treated with BiPAP   . 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 Colon surgery  2010    collapsed colon  . Trigger finger release      x4    FAMILY HISTORY: Family History  Problem Relation Age of Onset  . Arthritis Sister   . Sleep apnea Brother   . Diabetes Maternal Uncle   . Diabetes Other   . Heart failure Mother     SOCIAL HISTORY: Social History   Social History  . Marital Status: Married    Spouse Name: [name]  . Number of Children: 1  . Years of Education: 22   Occupational History  .      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 Smoking status: Never Smoker   . Smokeless tobacco: Never Used  . Alcohol Use: No  . Drug Use: No  . Sexual Activity: Not on file   Other Topics Concern  . 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   Patient has one adult child.   Patient is working part-time and is retired.   Patient has a Haematologist.   Patient is right-handed.   Patient drinks four cups of coffee daily.      PHYSICAL EXAM  Filed Vitals:   02/13/15 1113  BP: 161/91  Pulse: 80  Height: 6' 4" (1.93 m)  Weight: 241 lb 12.8 oz (109.68 kg)   Body mass index is 29.45 kg/(m^2).  Generalized: Well  developed, in no acute distress  Neck: Circumference 17-1/2 inches, Mallampati 3+  Neurological examination  Mentation: Alert oriented to time, place, history taking. Follows all commands speech and language fluent Cranial nerve II-XII: Pupils were equal round reactive to light. Extraocular movements were full, visual field were full on confrontational test. Facial sensation and strength were normal. Uvula tongue midline. Head turning and shoulder shrug  were normal and symmetric. Motor: The  motor testing reveals 5 over 5 strength of all 4 extremities. Good symmetric motor tone is noted throughout.  Sensory: Sensory testing is intact to soft touch on all 4 extremities. No evidence of extinction is noted.  Coordination: Cerebellar testing reveals good finger-nose-finger and heel-to-shin bilaterally.  Gait and station: Gait is normal. Tandem gait is normal. Romberg is negative. No drift is seen.  Reflexes: Deep tendon reflexes are symmetric and normal bilaterally.   DIAGNOSTIC DATA (LABS, IMAGING, TESTING) - I reviewed patient records, labs, notes, testing and imaging myself where available.    ASSESSMENT AND PLAN 65 y.o. 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 here with:  1. Obstructive sleep apnea on BiPAP  The patient's compliance download is excellent. He continues to have a significant leak. I have sent a prescription to his DME asking that they replace his supplies. I will also send [facility] a message insuring that this is taken care of. Patient advised that if his symptoms worsen or he develops any new symptoms he should let us know. He will follow-up in 6  months with Dr.Dohmeier     Ward Givens, MSN, NP-C 02/13/2015, 11:35 AM [facility] [address] [place], [place] [address] [phone]

## 2015-02-13 NOTE — Progress Notes (Signed)
I agree with the assessment and plan as directed by NP .The patient is known to me .   Manuel Lawhead, MD  

## 2015-02-19 DIAGNOSIS — G4733 Obstructive sleep apnea (adult) (pediatric): Secondary | ICD-10-CM | POA: Diagnosis not present

## 2015-02-23 DIAGNOSIS — M9901 Segmental and somatic dysfunction of cervical region: Secondary | ICD-10-CM | POA: Diagnosis not present

## 2015-02-23 DIAGNOSIS — M9903 Segmental and somatic dysfunction of lumbar region: Secondary | ICD-10-CM | POA: Diagnosis not present

## 2015-02-23 DIAGNOSIS — M5136 Other intervertebral disc degeneration, lumbar region: Secondary | ICD-10-CM | POA: Diagnosis not present

## 2015-02-23 DIAGNOSIS — M5030 Other cervical disc degeneration, unspecified cervical region: Secondary | ICD-10-CM | POA: Diagnosis not present

## 2015-02-24 DIAGNOSIS — E1042 Type 1 diabetes mellitus with diabetic polyneuropathy: Secondary | ICD-10-CM | POA: Diagnosis not present

## 2015-02-27 DIAGNOSIS — E1022 Type 1 diabetes mellitus with diabetic chronic kidney disease: Secondary | ICD-10-CM | POA: Diagnosis not present

## 2015-02-27 DIAGNOSIS — N183 Chronic kidney disease, stage 3 (moderate): Secondary | ICD-10-CM | POA: Diagnosis not present

## 2015-02-27 DIAGNOSIS — Z794 Long term (current) use of insulin: Secondary | ICD-10-CM | POA: Diagnosis not present

## 2015-02-27 DIAGNOSIS — E1042 Type 1 diabetes mellitus with diabetic polyneuropathy: Secondary | ICD-10-CM | POA: Diagnosis not present

## 2015-02-28 DIAGNOSIS — E1021 Type 1 diabetes mellitus with diabetic nephropathy: Secondary | ICD-10-CM | POA: Diagnosis not present

## 2015-03-12 DIAGNOSIS — E103593 Type 1 diabetes mellitus with proliferative diabetic retinopathy without macular edema, bilateral: Secondary | ICD-10-CM | POA: Diagnosis not present

## 2015-03-19 DIAGNOSIS — R809 Proteinuria, unspecified: Secondary | ICD-10-CM | POA: Diagnosis not present

## 2015-03-19 DIAGNOSIS — I10 Essential (primary) hypertension: Secondary | ICD-10-CM | POA: Diagnosis not present

## 2015-03-19 DIAGNOSIS — N250 Renal osteodystrophy: Secondary | ICD-10-CM | POA: Diagnosis not present

## 2015-03-19 DIAGNOSIS — N189 Chronic kidney disease, unspecified: Secondary | ICD-10-CM | POA: Diagnosis not present

## 2015-03-19 DIAGNOSIS — N183 Chronic kidney disease, stage 3 (moderate): Secondary | ICD-10-CM | POA: Diagnosis not present

## 2015-03-19 DIAGNOSIS — E119 Type 2 diabetes mellitus without complications: Secondary | ICD-10-CM | POA: Diagnosis not present

## 2015-03-19 DIAGNOSIS — D649 Anemia, unspecified: Secondary | ICD-10-CM | POA: Diagnosis not present

## 2015-03-21 DIAGNOSIS — M5136 Other intervertebral disc degeneration, lumbar region: Secondary | ICD-10-CM | POA: Diagnosis not present

## 2015-03-21 DIAGNOSIS — M9903 Segmental and somatic dysfunction of lumbar region: Secondary | ICD-10-CM | POA: Diagnosis not present

## 2015-03-21 DIAGNOSIS — M9901 Segmental and somatic dysfunction of cervical region: Secondary | ICD-10-CM | POA: Diagnosis not present

## 2015-03-21 DIAGNOSIS — M5030 Other cervical disc degeneration, unspecified cervical region: Secondary | ICD-10-CM | POA: Diagnosis not present

## 2015-03-23 DIAGNOSIS — E1021 Type 1 diabetes mellitus with diabetic nephropathy: Secondary | ICD-10-CM | POA: Diagnosis not present

## 2015-03-26 DIAGNOSIS — M9902 Segmental and somatic dysfunction of thoracic region: Secondary | ICD-10-CM | POA: Diagnosis not present

## 2015-03-26 DIAGNOSIS — M9904 Segmental and somatic dysfunction of sacral region: Secondary | ICD-10-CM | POA: Diagnosis not present

## 2015-03-26 DIAGNOSIS — M5136 Other intervertebral disc degeneration, lumbar region: Secondary | ICD-10-CM | POA: Diagnosis not present

## 2015-03-26 DIAGNOSIS — E1042 Type 1 diabetes mellitus with diabetic polyneuropathy: Secondary | ICD-10-CM | POA: Diagnosis not present

## 2015-03-26 DIAGNOSIS — M5030 Other cervical disc degeneration, unspecified cervical region: Secondary | ICD-10-CM | POA: Diagnosis not present

## 2015-03-26 DIAGNOSIS — M9901 Segmental and somatic dysfunction of cervical region: Secondary | ICD-10-CM | POA: Diagnosis not present

## 2015-03-26 DIAGNOSIS — M9903 Segmental and somatic dysfunction of lumbar region: Secondary | ICD-10-CM | POA: Diagnosis not present

## 2015-03-27 DIAGNOSIS — E1022 Type 1 diabetes mellitus with diabetic chronic kidney disease: Secondary | ICD-10-CM | POA: Diagnosis not present

## 2015-04-10 DIAGNOSIS — E784 Other hyperlipidemia: Secondary | ICD-10-CM | POA: Diagnosis not present

## 2015-04-10 DIAGNOSIS — Z1389 Encounter for screening for other disorder: Secondary | ICD-10-CM | POA: Diagnosis not present

## 2015-04-10 DIAGNOSIS — E1039 Type 1 diabetes mellitus with other diabetic ophthalmic complication: Secondary | ICD-10-CM | POA: Diagnosis not present

## 2015-04-10 DIAGNOSIS — E1040 Type 1 diabetes mellitus with diabetic neuropathy, unspecified: Secondary | ICD-10-CM | POA: Diagnosis not present

## 2015-04-10 DIAGNOSIS — E1029 Type 1 diabetes mellitus with other diabetic kidney complication: Secondary | ICD-10-CM | POA: Diagnosis not present

## 2015-04-10 DIAGNOSIS — H3093 Unspecified chorioretinal inflammation, bilateral: Secondary | ICD-10-CM | POA: Diagnosis not present

## 2015-04-10 DIAGNOSIS — I10 Essential (primary) hypertension: Secondary | ICD-10-CM | POA: Diagnosis not present

## 2015-04-10 DIAGNOSIS — N183 Chronic kidney disease, stage 3 (moderate): Secondary | ICD-10-CM | POA: Diagnosis not present

## 2015-04-10 DIAGNOSIS — Z6830 Body mass index (BMI) 30.0-30.9, adult: Secondary | ICD-10-CM | POA: Diagnosis not present

## 2015-04-10 DIAGNOSIS — G4733 Obstructive sleep apnea (adult) (pediatric): Secondary | ICD-10-CM | POA: Diagnosis not present

## 2015-04-16 DIAGNOSIS — H43811 Vitreous degeneration, right eye: Secondary | ICD-10-CM | POA: Diagnosis not present

## 2015-04-16 DIAGNOSIS — E113591 Type 2 diabetes mellitus with proliferative diabetic retinopathy without macular edema, right eye: Secondary | ICD-10-CM | POA: Diagnosis not present

## 2015-04-17 DIAGNOSIS — E103593 Type 1 diabetes mellitus with proliferative diabetic retinopathy without macular edema, bilateral: Secondary | ICD-10-CM | POA: Diagnosis not present

## 2015-04-17 DIAGNOSIS — H4313 Vitreous hemorrhage, bilateral: Secondary | ICD-10-CM | POA: Diagnosis not present

## 2015-04-18 DIAGNOSIS — M9901 Segmental and somatic dysfunction of cervical region: Secondary | ICD-10-CM | POA: Diagnosis not present

## 2015-04-18 DIAGNOSIS — M9903 Segmental and somatic dysfunction of lumbar region: Secondary | ICD-10-CM | POA: Diagnosis not present

## 2015-04-18 DIAGNOSIS — M5136 Other intervertebral disc degeneration, lumbar region: Secondary | ICD-10-CM | POA: Diagnosis not present

## 2015-04-18 DIAGNOSIS — M9904 Segmental and somatic dysfunction of sacral region: Secondary | ICD-10-CM | POA: Diagnosis not present

## 2015-04-18 DIAGNOSIS — M9902 Segmental and somatic dysfunction of thoracic region: Secondary | ICD-10-CM | POA: Diagnosis not present

## 2015-04-18 DIAGNOSIS — M5030 Other cervical disc degeneration, unspecified cervical region: Secondary | ICD-10-CM | POA: Diagnosis not present

## 2015-04-19 DIAGNOSIS — J029 Acute pharyngitis, unspecified: Secondary | ICD-10-CM | POA: Diagnosis not present

## 2015-04-19 DIAGNOSIS — Z6830 Body mass index (BMI) 30.0-30.9, adult: Secondary | ICD-10-CM | POA: Diagnosis not present

## 2015-04-19 DIAGNOSIS — J019 Acute sinusitis, unspecified: Secondary | ICD-10-CM | POA: Diagnosis not present

## 2015-04-26 DIAGNOSIS — E1021 Type 1 diabetes mellitus with diabetic nephropathy: Secondary | ICD-10-CM | POA: Diagnosis not present

## 2015-04-28 DIAGNOSIS — E1022 Type 1 diabetes mellitus with diabetic chronic kidney disease: Secondary | ICD-10-CM | POA: Diagnosis not present

## 2015-04-30 DIAGNOSIS — E1042 Type 1 diabetes mellitus with diabetic polyneuropathy: Secondary | ICD-10-CM | POA: Diagnosis not present

## 2015-05-03 DIAGNOSIS — E103512 Type 1 diabetes mellitus with proliferative diabetic retinopathy with macular edema, left eye: Secondary | ICD-10-CM | POA: Diagnosis not present

## 2015-05-03 DIAGNOSIS — E103511 Type 1 diabetes mellitus with proliferative diabetic retinopathy with macular edema, right eye: Secondary | ICD-10-CM | POA: Diagnosis not present

## 2015-05-08 DIAGNOSIS — M9902 Segmental and somatic dysfunction of thoracic region: Secondary | ICD-10-CM | POA: Diagnosis not present

## 2015-05-08 DIAGNOSIS — M9904 Segmental and somatic dysfunction of sacral region: Secondary | ICD-10-CM | POA: Diagnosis not present

## 2015-05-08 DIAGNOSIS — M9903 Segmental and somatic dysfunction of lumbar region: Secondary | ICD-10-CM | POA: Diagnosis not present

## 2015-05-08 DIAGNOSIS — M9901 Segmental and somatic dysfunction of cervical region: Secondary | ICD-10-CM | POA: Diagnosis not present

## 2015-05-08 DIAGNOSIS — M5136 Other intervertebral disc degeneration, lumbar region: Secondary | ICD-10-CM | POA: Diagnosis not present

## 2015-05-08 DIAGNOSIS — M5030 Other cervical disc degeneration, unspecified cervical region: Secondary | ICD-10-CM | POA: Diagnosis not present

## 2015-05-24 DIAGNOSIS — E103591 Type 1 diabetes mellitus with proliferative diabetic retinopathy without macular edema, right eye: Secondary | ICD-10-CM | POA: Diagnosis not present

## 2015-05-25 DIAGNOSIS — E1021 Type 1 diabetes mellitus with diabetic nephropathy: Secondary | ICD-10-CM | POA: Diagnosis not present

## 2015-06-01 DIAGNOSIS — E1022 Type 1 diabetes mellitus with diabetic chronic kidney disease: Secondary | ICD-10-CM | POA: Diagnosis not present

## 2015-06-08 DIAGNOSIS — E1042 Type 1 diabetes mellitus with diabetic polyneuropathy: Secondary | ICD-10-CM | POA: Diagnosis not present

## 2015-06-15 DIAGNOSIS — M9904 Segmental and somatic dysfunction of sacral region: Secondary | ICD-10-CM | POA: Diagnosis not present

## 2015-06-15 DIAGNOSIS — M5136 Other intervertebral disc degeneration, lumbar region: Secondary | ICD-10-CM | POA: Diagnosis not present

## 2015-06-15 DIAGNOSIS — M5030 Other cervical disc degeneration, unspecified cervical region: Secondary | ICD-10-CM | POA: Diagnosis not present

## 2015-06-15 DIAGNOSIS — M9902 Segmental and somatic dysfunction of thoracic region: Secondary | ICD-10-CM | POA: Diagnosis not present

## 2015-06-15 DIAGNOSIS — M9901 Segmental and somatic dysfunction of cervical region: Secondary | ICD-10-CM | POA: Diagnosis not present

## 2015-06-15 DIAGNOSIS — M9903 Segmental and somatic dysfunction of lumbar region: Secondary | ICD-10-CM | POA: Diagnosis not present

## 2015-06-21 DIAGNOSIS — H4313 Vitreous hemorrhage, bilateral: Secondary | ICD-10-CM | POA: Diagnosis not present

## 2015-06-21 DIAGNOSIS — E113593 Type 2 diabetes mellitus with proliferative diabetic retinopathy without macular edema, bilateral: Secondary | ICD-10-CM | POA: Diagnosis not present

## 2015-06-27 DIAGNOSIS — E1021 Type 1 diabetes mellitus with diabetic nephropathy: Secondary | ICD-10-CM | POA: Diagnosis not present

## 2015-06-27 DIAGNOSIS — E1022 Type 1 diabetes mellitus with diabetic chronic kidney disease: Secondary | ICD-10-CM | POA: Diagnosis not present

## 2015-06-27 DIAGNOSIS — Z794 Long term (current) use of insulin: Secondary | ICD-10-CM | POA: Diagnosis not present

## 2015-06-27 DIAGNOSIS — E1042 Type 1 diabetes mellitus with diabetic polyneuropathy: Secondary | ICD-10-CM | POA: Diagnosis not present

## 2015-06-27 DIAGNOSIS — N183 Chronic kidney disease, stage 3 (moderate): Secondary | ICD-10-CM | POA: Diagnosis not present

## 2015-06-29 DIAGNOSIS — M9903 Segmental and somatic dysfunction of lumbar region: Secondary | ICD-10-CM | POA: Diagnosis not present

## 2015-06-29 DIAGNOSIS — M9902 Segmental and somatic dysfunction of thoracic region: Secondary | ICD-10-CM | POA: Diagnosis not present

## 2015-06-29 DIAGNOSIS — M9904 Segmental and somatic dysfunction of sacral region: Secondary | ICD-10-CM | POA: Diagnosis not present

## 2015-06-29 DIAGNOSIS — M5136 Other intervertebral disc degeneration, lumbar region: Secondary | ICD-10-CM | POA: Diagnosis not present

## 2015-06-29 DIAGNOSIS — M9901 Segmental and somatic dysfunction of cervical region: Secondary | ICD-10-CM | POA: Diagnosis not present

## 2015-06-29 DIAGNOSIS — M5030 Other cervical disc degeneration, unspecified cervical region: Secondary | ICD-10-CM | POA: Diagnosis not present

## 2015-07-03 DIAGNOSIS — E1022 Type 1 diabetes mellitus with diabetic chronic kidney disease: Secondary | ICD-10-CM | POA: Diagnosis not present

## 2015-07-25 DIAGNOSIS — E1022 Type 1 diabetes mellitus with diabetic chronic kidney disease: Secondary | ICD-10-CM | POA: Diagnosis not present

## 2015-07-26 DIAGNOSIS — E1021 Type 1 diabetes mellitus with diabetic nephropathy: Secondary | ICD-10-CM | POA: Diagnosis not present

## 2015-08-05 DIAGNOSIS — E1022 Type 1 diabetes mellitus with diabetic chronic kidney disease: Secondary | ICD-10-CM | POA: Diagnosis not present

## 2015-08-13 ENCOUNTER — Ambulatory Visit: Payer: Medicare Other | Admitting: Neurology

## 2015-08-16 DIAGNOSIS — E103592 Type 1 diabetes mellitus with proliferative diabetic retinopathy without macular edema, left eye: Secondary | ICD-10-CM | POA: Diagnosis not present

## 2015-08-16 DIAGNOSIS — E103591 Type 1 diabetes mellitus with proliferative diabetic retinopathy without macular edema, right eye: Secondary | ICD-10-CM | POA: Diagnosis not present

## 2015-08-17 DIAGNOSIS — M9903 Segmental and somatic dysfunction of lumbar region: Secondary | ICD-10-CM | POA: Diagnosis not present

## 2015-08-17 DIAGNOSIS — M9904 Segmental and somatic dysfunction of sacral region: Secondary | ICD-10-CM | POA: Diagnosis not present

## 2015-08-17 DIAGNOSIS — M5136 Other intervertebral disc degeneration, lumbar region: Secondary | ICD-10-CM | POA: Diagnosis not present

## 2015-08-17 DIAGNOSIS — M9901 Segmental and somatic dysfunction of cervical region: Secondary | ICD-10-CM | POA: Diagnosis not present

## 2015-08-17 DIAGNOSIS — M5030 Other cervical disc degeneration, unspecified cervical region: Secondary | ICD-10-CM | POA: Diagnosis not present

## 2015-08-17 DIAGNOSIS — M9902 Segmental and somatic dysfunction of thoracic region: Secondary | ICD-10-CM | POA: Diagnosis not present

## 2015-08-23 DIAGNOSIS — E1021 Type 1 diabetes mellitus with diabetic nephropathy: Secondary | ICD-10-CM | POA: Diagnosis not present

## 2015-08-25 DIAGNOSIS — E1022 Type 1 diabetes mellitus with diabetic chronic kidney disease: Secondary | ICD-10-CM | POA: Diagnosis not present

## 2015-08-29 ENCOUNTER — Encounter: Payer: Self-pay | Admitting: Neurology

## 2015-08-29 ENCOUNTER — Ambulatory Visit (INDEPENDENT_AMBULATORY_CARE_PROVIDER_SITE_OTHER): Payer: Medicare Other | Admitting: Neurology

## 2015-08-29 VITALS — BP 130/80 | Resp 20 | Ht 75.0 in | Wt 233.0 lb

## 2015-08-29 DIAGNOSIS — G47419 Narcolepsy without cataplexy: Secondary | ICD-10-CM

## 2015-08-29 DIAGNOSIS — G4733 Obstructive sleep apnea (adult) (pediatric): Secondary | ICD-10-CM | POA: Diagnosis not present

## 2015-08-29 DIAGNOSIS — G4730 Sleep apnea, unspecified: Secondary | ICD-10-CM

## 2015-08-29 NOTE — Progress Notes (Signed)
[facility]  Provider:  Larey Seat, M D  Referring Provider: Marton Redwood, MD Primary Care Physician:  Marton Redwood, MD  Chief Complaint  Patient presents with  . Follow-up    narcolepsy, cpap    HPI:  [name] is a 65 y.o. [place] born, caucasian, married  male -   He is seen here as a  revisit  from Dr. Lutricia Feil, his internist, for Sleep apnea  BiPAP-follow up. He has plans to visit the country [place] his birth and wants to make sure that BiPAP can be used on flight and at destination. He has a long history of DM type 1 .  Established patient - since 2012, right-handed married  gentleman, with a history of complex apnea.  Had headaches before treatment with BiPAP, his HbA1c have been in the low 6 range. Memory has declined. His high fatigue and Epworth scores remained high after BiPAP. He was tested for narcolepsy by HLA: positive ! He just left for a journey to [place] when we got these result, and we meet now after his return. He endorses today Epworth at 9, feels rested and restored. He feels that he can drive and is alert for hours, no sleep attacks.  Does he need medications ? No, or not yet. Explained the genetic and autoimmune barrier to narcolepsy.   Last visit note CD.   He had sleep complains of persistent excessive daytime sleepiness despite nightly use of CPAP associated with restless legs and snoring. The patient also has a long-standing history of type 1 diabetes. A split-night study was performed on 01-22-11 showed evidence of CPAP and was central apneas and the patient was asked to return to use a BiPAP with backup rate or an adaptive serval ventilation device. At the time in beverages doesn't 13 the patient endorsed the Epworth sleepiness score at 17/23 points and the backs inventory at 16 points his BMI was 30.2 his neck circumference 17.5 inches. The patient responded best to adapt SV is an EEP- Expiratory pressure of 10 cm,  and  pressure support of 4 cm minimum and 15 cm maximum. He did have problems is continued periodic limb movements at over 60 per hour and associated arousals at about 22 per hour. Sept 2014 download from his recent machine but showed residual AHI of 2.6,  thereby complete resolution of his apnea, he is using a minimum pressure support of 4 cm water maximum pressure support of 10 cm water and EEP at 10 cm.  Epworth 15 points, FSS 48,  GDS 2 .   Interval history for 08/29/2015, Mr. [name] is here today for his ASV compliance visit. He has been using his machine and automatic servo ventilation with an expiratory pressure support of 10 cm water and a minimum pressure support of 4 cm . Maximum of 10 cm water pressure and a residual AHI of 7.3,but  he does have high air leaks. The residual AHI consists of hypopneas.   His compliance is 100% with  8 hours and 23 minutes on average nightly use of nasal pillow interface.[name] His Epworth sleepiness score is endorsed at 12 points , his geriatric depression score is endorsed at 2 out of 15 points. He reports having word finding difficulties and at times what he calls " garbled speech" . He would be placed words with a sequence of sounds that makes sense to him but not to the person listening to him, it is not a paraphasic error  and it is not using a word that sounds alike. He noted delayed word finding and " pulling a blank ", has not gotten lost while driving. He feels at times a lack of focus.  I added today a [place] cognitive assessment the patient did excellent on visual spatial and executive function, recalled 3 of 5 words, and was able to generate 17 words. 27/30 points. Montreal Cognitive Assessment  08/29/2015  Visuospatial/ Executive (0/5) 5  Naming (0/3) 3  Attention: Read list of digits (0/2) 1  Attention: Read list of letters (0/1) 1  Attention: Serial 7 subtraction starting at 100 (0/3) 3  Language: Repeat phrase (0/2) 2  Language : Fluency  (0/1) 1  Abstraction (0/2) 2  Delayed Recall (0/5) 3  Orientation (0/6) 6  Total 27  Adjusted Score (based on education) 27    No flowsheet data found.   Social History   Social History  . Marital status: Married    Spouse name: [name]  . Number of children: 1  . Years of education: 9   Occupational History  .      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 Smoking status: Never Smoker  . Smokeless tobacco: Never Used  . Alcohol use No  . Drug use: No  . Sexual activity: Not on file   Other Topics Concern  . 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   Patient has one adult child.   Patient is working part-time and is retired.   Patient has a Haematologist.   Patient is right-handed.   Patient drinks four cups of coffee daily.    Family History  Problem Relation Age of Onset  . Arthritis Sister   . Sleep apnea Brother   . Diabetes Maternal Uncle   . Diabetes Other   . Heart failure Mother     Past Medical History:  Diagnosis Date  . Anxiety   . Arthritis   . Diabetes mellitus without complication ([place])   . Head injury 09/16/12   12 stitches  . Hypertension   . Migraine   . 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 OSA (obstructive sleep apnea)   . OSA treated with BiPAP   . Sleep apnea with use of continuous positive airway pressure (CPAP)    sleep study revealed centrals, adapt SV EPAP at 10 cm water 02-19-11     Past Surgical History:  Procedure Laterality Date  . COLON SURGERY  2010   collapsed colon  . TRIGGER FINGER RELEASE     x4    Current Outpatient Prescriptions  Medication Sig Dispense Refill  . amLODipine (NORVASC) 5 MG tablet Take 5 mg by mouth daily.    [name] CINNAMON PO Take 2 tablets by mouth daily.    . citalopram (CELEXA) 20 MG tablet Take 20 mg by mouth daily.    . cloNIDine (CATAPRES)  0.1 MG tablet Take 0.1 mg by mouth daily as needed (for high BP).    . fluocinonide cream (LIDEX) 0.05 %   2  . hydrochlorothiazide (MICROZIDE) 12.5 MG capsule Take 25 mg by mouth daily.     [name] ibuprofen (ADVIL,MOTRIN) 200 MG tablet Take 200-400 mg by mouth every 6 (six) hours as needed for pain.    . Insulin Human (INSULIN PUMP) 100 unit/ml SOLN Inject 70  each into the skin continuous. Humalog insulin    . Multiple Vitamin (MULTIVITAMIN WITH MINERALS) TABS tablet Take 1 tablet by mouth daily.    [name] omeprazole (PRILOSEC) 40 MG capsule Take by mouth.    Vladimir Faster Glycol-Propyl Glycol (SYSTANE) 0.4-0.3 % SOLN Apply 2 drops to eye 3 (three) times daily.    . vitamin C (ASCORBIC ACID) 500 MG tablet Take 500 mg by mouth daily.     No current facility-administered medications for this visit.     Allergies as of 08/29/2015 - Review Complete 08/29/2015  Allergen Reaction Noted  . Codeine Nausea And Vomiting 10/27/2011  . Dilaudid [hydromorphone hcl] Anaphylaxis 10/27/2011  . Gluten meal Other (See Comments) 09/16/2012  . Morphine and related Anaphylaxis 10/27/2011  . Toradol [ketorolac tromethamine] Nausea And Vomiting 10/27/2011  . Penicillins Other (See Comments) 10/27/2011    Vitals: BP 130/80   Resp 20   Ht 6' 3" (1.905 m)   Wt 233 lb (105.7 kg)   BMI 29.12 kg/m  Last Weight:  Wt Readings from Last 1 Encounters:  08/29/15 233 lb (105.7 kg)   Last Height:   Ht Readings from Last 1 Encounters:  08/29/15 6' 3" (1.905 m)    Physical exam:  General: The patient is awake, alert and appears not in acute distress. The patient is well groomed. Head: Normocephalic, facial scar - from a recent accident " while horsing around". No fall, no faint, no LOC.   Neck is supple. Mallampati 3 , neck circumference: 15. 5.  Cardiovascular:  Regular rate and rhythm, without murmurs or carotid bruit, and without distended neck veins. Respiratory: Lungs are clear to auscultation. Skin:  Without  evidence of edema, or rash Trunk: BMI is reduced , patient  has normal posture.  Neurologic exam :The patient is awake and alert, oriented to place and time.  Memory subjective  described as impaired , short term memory primarily- he forgets apointments and parts of conversation.  There is a normal attention span & concentration ability. Speech is fluent without  dysarthria, dysphonia or aphasia. Mood and affect are appropriate. Cranial nerves:Pupils are equal and slowed  reactive to light. Funduscopic exam with  pallor , not  Edema. He underwent a laser surgery for retinopathy and an injury .  His vision has improved for acuity, no defiiciency in color perception.Extraocular movements  in vertical and horizontal planes intact and without nystagmus. Visual fields by finger perimetry are intact. Hearing to finger rub intact.  Facial sensation intact to fine touch. Facial motor strength is symmetric and tongue and uvula move midline. Motor exam:   Normal tone and normal muscle bulk and symmetric, attenuated -  He has shoulder movement limitations. ROM. normal strength in all extremities. 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 Unable to feel vibration and filament touch below the ankles bilaterally, skin is shiny , dystrophic over both feet.  Coordination: Rapid alternating movements in the fingers/hands is tested and normal. Finger-to-nose maneuver tested and noted dysmetria , not  tremor. Gait and station: Patient walks without assistive device . Strength within normal limits. Steps are unfragmented. Romberg testing is positive ! Deep tendon reflexes: in the  upper and lower extremities are attenauted, symmetric and intact. Babinski maneuver response is equivocal .   Assessment:  1) patient with complex apnea and HLA positive narcolepsy - he is still in doubt about the narcolepsy . Manages with scheduled naps.  Works part time.  2) MMSE impairment ? [facility] is is doing well, but he feels a significant impairment in comparison to his younger self- doesn't like peer to peer comparison .  3) diabetic neuropathy in adult onset DM 1 .   Plan:  Treatment plan and additional workup :continue its use with current mask.   q 6 month RV for stimulant medication follow up, MOCA , ASV follow up.   Jalyssa Fleisher, MD

## 2015-08-29 NOTE — Patient Instructions (Signed)
We will schedule an MRI after your 66th birthday.  MOCA with next visit, NP or me.

## 2015-09-10 DIAGNOSIS — E1022 Type 1 diabetes mellitus with diabetic chronic kidney disease: Secondary | ICD-10-CM | POA: Diagnosis not present

## 2015-09-14 DIAGNOSIS — M5136 Other intervertebral disc degeneration, lumbar region: Secondary | ICD-10-CM | POA: Diagnosis not present

## 2015-09-14 DIAGNOSIS — M9904 Segmental and somatic dysfunction of sacral region: Secondary | ICD-10-CM | POA: Diagnosis not present

## 2015-09-14 DIAGNOSIS — M9902 Segmental and somatic dysfunction of thoracic region: Secondary | ICD-10-CM | POA: Diagnosis not present

## 2015-09-14 DIAGNOSIS — M5030 Other cervical disc degeneration, unspecified cervical region: Secondary | ICD-10-CM | POA: Diagnosis not present

## 2015-09-14 DIAGNOSIS — M9903 Segmental and somatic dysfunction of lumbar region: Secondary | ICD-10-CM | POA: Diagnosis not present

## 2015-09-14 DIAGNOSIS — M9901 Segmental and somatic dysfunction of cervical region: Secondary | ICD-10-CM | POA: Diagnosis not present

## 2015-09-26 DIAGNOSIS — E1021 Type 1 diabetes mellitus with diabetic nephropathy: Secondary | ICD-10-CM | POA: Diagnosis not present

## 2015-10-05 DIAGNOSIS — Z794 Long term (current) use of insulin: Secondary | ICD-10-CM | POA: Diagnosis not present

## 2015-10-05 DIAGNOSIS — E1022 Type 1 diabetes mellitus with diabetic chronic kidney disease: Secondary | ICD-10-CM | POA: Diagnosis not present

## 2015-10-05 DIAGNOSIS — E1042 Type 1 diabetes mellitus with diabetic polyneuropathy: Secondary | ICD-10-CM | POA: Diagnosis not present

## 2015-10-05 DIAGNOSIS — N183 Chronic kidney disease, stage 3 (moderate): Secondary | ICD-10-CM | POA: Diagnosis not present

## 2015-10-05 DIAGNOSIS — Z23 Encounter for immunization: Secondary | ICD-10-CM | POA: Diagnosis not present

## 2015-10-14 DIAGNOSIS — E1022 Type 1 diabetes mellitus with diabetic chronic kidney disease: Secondary | ICD-10-CM | POA: Diagnosis not present

## 2015-10-16 DIAGNOSIS — M9901 Segmental and somatic dysfunction of cervical region: Secondary | ICD-10-CM | POA: Diagnosis not present

## 2015-10-16 DIAGNOSIS — M5030 Other cervical disc degeneration, unspecified cervical region: Secondary | ICD-10-CM | POA: Diagnosis not present

## 2015-10-16 DIAGNOSIS — M9904 Segmental and somatic dysfunction of sacral region: Secondary | ICD-10-CM | POA: Diagnosis not present

## 2015-10-16 DIAGNOSIS — M9903 Segmental and somatic dysfunction of lumbar region: Secondary | ICD-10-CM | POA: Diagnosis not present

## 2015-10-16 DIAGNOSIS — M5136 Other intervertebral disc degeneration, lumbar region: Secondary | ICD-10-CM | POA: Diagnosis not present

## 2015-10-16 DIAGNOSIS — M9902 Segmental and somatic dysfunction of thoracic region: Secondary | ICD-10-CM | POA: Diagnosis not present

## 2015-10-17 DIAGNOSIS — E119 Type 2 diabetes mellitus without complications: Secondary | ICD-10-CM | POA: Diagnosis not present

## 2015-10-17 DIAGNOSIS — N183 Chronic kidney disease, stage 3 (moderate): Secondary | ICD-10-CM | POA: Diagnosis not present

## 2015-10-17 DIAGNOSIS — I10 Essential (primary) hypertension: Secondary | ICD-10-CM | POA: Diagnosis not present

## 2015-10-25 DIAGNOSIS — E1029 Type 1 diabetes mellitus with other diabetic kidney complication: Secondary | ICD-10-CM | POA: Diagnosis not present

## 2015-10-25 DIAGNOSIS — I10 Essential (primary) hypertension: Secondary | ICD-10-CM | POA: Diagnosis not present

## 2015-10-25 DIAGNOSIS — Z125 Encounter for screening for malignant neoplasm of prostate: Secondary | ICD-10-CM | POA: Diagnosis not present

## 2015-10-25 DIAGNOSIS — E1021 Type 1 diabetes mellitus with diabetic nephropathy: Secondary | ICD-10-CM | POA: Diagnosis not present

## 2015-10-26 DIAGNOSIS — E1022 Type 1 diabetes mellitus with diabetic chronic kidney disease: Secondary | ICD-10-CM | POA: Diagnosis not present

## 2015-11-01 DIAGNOSIS — H3093 Unspecified chorioretinal inflammation, bilateral: Secondary | ICD-10-CM | POA: Diagnosis not present

## 2015-11-01 DIAGNOSIS — N183 Chronic kidney disease, stage 3 (moderate): Secondary | ICD-10-CM | POA: Diagnosis not present

## 2015-11-01 DIAGNOSIS — I10 Essential (primary) hypertension: Secondary | ICD-10-CM | POA: Diagnosis not present

## 2015-11-01 DIAGNOSIS — E784 Other hyperlipidemia: Secondary | ICD-10-CM | POA: Diagnosis not present

## 2015-11-01 DIAGNOSIS — E1039 Type 1 diabetes mellitus with other diabetic ophthalmic complication: Secondary | ICD-10-CM | POA: Diagnosis not present

## 2015-11-01 DIAGNOSIS — E1029 Type 1 diabetes mellitus with other diabetic kidney complication: Secondary | ICD-10-CM | POA: Diagnosis not present

## 2015-11-01 DIAGNOSIS — G6289 Other specified polyneuropathies: Secondary | ICD-10-CM | POA: Diagnosis not present

## 2015-11-01 DIAGNOSIS — Z0000 Encounter for general adult medical examination without abnormal findings: Secondary | ICD-10-CM | POA: Diagnosis not present

## 2015-11-01 DIAGNOSIS — E1040 Type 1 diabetes mellitus with diabetic neuropathy, unspecified: Secondary | ICD-10-CM | POA: Diagnosis not present

## 2015-11-01 DIAGNOSIS — Z6830 Body mass index (BMI) 30.0-30.9, adult: Secondary | ICD-10-CM | POA: Diagnosis not present

## 2015-11-12 DIAGNOSIS — M5136 Other intervertebral disc degeneration, lumbar region: Secondary | ICD-10-CM | POA: Diagnosis not present

## 2015-11-12 DIAGNOSIS — M5030 Other cervical disc degeneration, unspecified cervical region: Secondary | ICD-10-CM | POA: Diagnosis not present

## 2015-11-12 DIAGNOSIS — M9902 Segmental and somatic dysfunction of thoracic region: Secondary | ICD-10-CM | POA: Diagnosis not present

## 2015-11-12 DIAGNOSIS — M9904 Segmental and somatic dysfunction of sacral region: Secondary | ICD-10-CM | POA: Diagnosis not present

## 2015-11-12 DIAGNOSIS — M9901 Segmental and somatic dysfunction of cervical region: Secondary | ICD-10-CM | POA: Diagnosis not present

## 2015-11-12 DIAGNOSIS — M9903 Segmental and somatic dysfunction of lumbar region: Secondary | ICD-10-CM | POA: Diagnosis not present

## 2015-11-14 DIAGNOSIS — M542 Cervicalgia: Secondary | ICD-10-CM | POA: Diagnosis not present

## 2015-11-14 DIAGNOSIS — E1022 Type 1 diabetes mellitus with diabetic chronic kidney disease: Secondary | ICD-10-CM | POA: Diagnosis not present

## 2015-11-14 DIAGNOSIS — M75101 Unspecified rotator cuff tear or rupture of right shoulder, not specified as traumatic: Secondary | ICD-10-CM | POA: Diagnosis not present

## 2015-11-14 DIAGNOSIS — M75102 Unspecified rotator cuff tear or rupture of left shoulder, not specified as traumatic: Secondary | ICD-10-CM | POA: Diagnosis not present

## 2015-11-15 DIAGNOSIS — E103592 Type 1 diabetes mellitus with proliferative diabetic retinopathy without macular edema, left eye: Secondary | ICD-10-CM | POA: Diagnosis not present

## 2015-11-15 DIAGNOSIS — E103591 Type 1 diabetes mellitus with proliferative diabetic retinopathy without macular edema, right eye: Secondary | ICD-10-CM | POA: Diagnosis not present

## 2015-11-27 DIAGNOSIS — E1021 Type 1 diabetes mellitus with diabetic nephropathy: Secondary | ICD-10-CM | POA: Diagnosis not present

## 2015-12-17 DIAGNOSIS — E1022 Type 1 diabetes mellitus with diabetic chronic kidney disease: Secondary | ICD-10-CM | POA: Diagnosis not present

## 2015-12-18 DIAGNOSIS — M5136 Other intervertebral disc degeneration, lumbar region: Secondary | ICD-10-CM | POA: Diagnosis not present

## 2015-12-18 DIAGNOSIS — M9903 Segmental and somatic dysfunction of lumbar region: Secondary | ICD-10-CM | POA: Diagnosis not present

## 2015-12-18 DIAGNOSIS — M5030 Other cervical disc degeneration, unspecified cervical region: Secondary | ICD-10-CM | POA: Diagnosis not present

## 2015-12-18 DIAGNOSIS — M9904 Segmental and somatic dysfunction of sacral region: Secondary | ICD-10-CM | POA: Diagnosis not present

## 2015-12-18 DIAGNOSIS — M9902 Segmental and somatic dysfunction of thoracic region: Secondary | ICD-10-CM | POA: Diagnosis not present

## 2015-12-18 DIAGNOSIS — M9901 Segmental and somatic dysfunction of cervical region: Secondary | ICD-10-CM | POA: Diagnosis not present

## 2015-12-26 DIAGNOSIS — M9901 Segmental and somatic dysfunction of cervical region: Secondary | ICD-10-CM | POA: Diagnosis not present

## 2015-12-26 DIAGNOSIS — M9902 Segmental and somatic dysfunction of thoracic region: Secondary | ICD-10-CM | POA: Diagnosis not present

## 2015-12-26 DIAGNOSIS — M5030 Other cervical disc degeneration, unspecified cervical region: Secondary | ICD-10-CM | POA: Diagnosis not present

## 2015-12-26 DIAGNOSIS — M5136 Other intervertebral disc degeneration, lumbar region: Secondary | ICD-10-CM | POA: Diagnosis not present

## 2015-12-26 DIAGNOSIS — M9904 Segmental and somatic dysfunction of sacral region: Secondary | ICD-10-CM | POA: Diagnosis not present

## 2015-12-26 DIAGNOSIS — M9903 Segmental and somatic dysfunction of lumbar region: Secondary | ICD-10-CM | POA: Diagnosis not present

## 2015-12-28 DIAGNOSIS — E1021 Type 1 diabetes mellitus with diabetic nephropathy: Secondary | ICD-10-CM | POA: Diagnosis not present

## 2016-01-02 DIAGNOSIS — E1042 Type 1 diabetes mellitus with diabetic polyneuropathy: Secondary | ICD-10-CM | POA: Diagnosis not present

## 2016-01-11 DIAGNOSIS — M5030 Other cervical disc degeneration, unspecified cervical region: Secondary | ICD-10-CM | POA: Diagnosis not present

## 2016-01-11 DIAGNOSIS — M9902 Segmental and somatic dysfunction of thoracic region: Secondary | ICD-10-CM | POA: Diagnosis not present

## 2016-01-11 DIAGNOSIS — M9904 Segmental and somatic dysfunction of sacral region: Secondary | ICD-10-CM | POA: Diagnosis not present

## 2016-01-11 DIAGNOSIS — M9903 Segmental and somatic dysfunction of lumbar region: Secondary | ICD-10-CM | POA: Diagnosis not present

## 2016-01-11 DIAGNOSIS — M5136 Other intervertebral disc degeneration, lumbar region: Secondary | ICD-10-CM | POA: Diagnosis not present

## 2016-01-11 DIAGNOSIS — M9901 Segmental and somatic dysfunction of cervical region: Secondary | ICD-10-CM | POA: Diagnosis not present

## 2016-01-17 DIAGNOSIS — E1022 Type 1 diabetes mellitus with diabetic chronic kidney disease: Secondary | ICD-10-CM | POA: Diagnosis not present

## 2016-01-28 DIAGNOSIS — E1021 Type 1 diabetes mellitus with diabetic nephropathy: Secondary | ICD-10-CM | POA: Diagnosis not present

## 2016-01-29 DIAGNOSIS — L814 Other melanin hyperpigmentation: Secondary | ICD-10-CM | POA: Diagnosis not present

## 2016-01-29 DIAGNOSIS — I8312 Varicose veins of left lower extremity with inflammation: Secondary | ICD-10-CM | POA: Diagnosis not present

## 2016-01-29 DIAGNOSIS — L821 Other seborrheic keratosis: Secondary | ICD-10-CM | POA: Diagnosis not present

## 2016-01-29 DIAGNOSIS — I8311 Varicose veins of right lower extremity with inflammation: Secondary | ICD-10-CM | POA: Diagnosis not present

## 2016-01-29 DIAGNOSIS — D0462 Carcinoma in situ of skin of left upper limb, including shoulder: Secondary | ICD-10-CM | POA: Diagnosis not present

## 2016-01-29 DIAGNOSIS — Z85828 Personal history of other malignant neoplasm of skin: Secondary | ICD-10-CM | POA: Diagnosis not present

## 2016-01-29 DIAGNOSIS — N183 Chronic kidney disease, stage 3 (moderate): Secondary | ICD-10-CM | POA: Diagnosis not present

## 2016-01-29 DIAGNOSIS — E1042 Type 1 diabetes mellitus with diabetic polyneuropathy: Secondary | ICD-10-CM | POA: Diagnosis not present

## 2016-01-29 DIAGNOSIS — E1022 Type 1 diabetes mellitus with diabetic chronic kidney disease: Secondary | ICD-10-CM | POA: Diagnosis not present

## 2016-01-29 DIAGNOSIS — Z794 Long term (current) use of insulin: Secondary | ICD-10-CM | POA: Diagnosis not present

## 2016-01-29 DIAGNOSIS — L570 Actinic keratosis: Secondary | ICD-10-CM | POA: Diagnosis not present

## 2016-01-29 DIAGNOSIS — I872 Venous insufficiency (chronic) (peripheral): Secondary | ICD-10-CM | POA: Diagnosis not present

## 2016-01-29 DIAGNOSIS — D225 Melanocytic nevi of trunk: Secondary | ICD-10-CM | POA: Diagnosis not present

## 2016-02-11 DIAGNOSIS — M9903 Segmental and somatic dysfunction of lumbar region: Secondary | ICD-10-CM | POA: Diagnosis not present

## 2016-02-11 DIAGNOSIS — M5136 Other intervertebral disc degeneration, lumbar region: Secondary | ICD-10-CM | POA: Diagnosis not present

## 2016-02-11 DIAGNOSIS — M9901 Segmental and somatic dysfunction of cervical region: Secondary | ICD-10-CM | POA: Diagnosis not present

## 2016-02-11 DIAGNOSIS — M5030 Other cervical disc degeneration, unspecified cervical region: Secondary | ICD-10-CM | POA: Diagnosis not present

## 2016-02-16 DIAGNOSIS — E1022 Type 1 diabetes mellitus with diabetic chronic kidney disease: Secondary | ICD-10-CM | POA: Diagnosis not present

## 2016-02-27 ENCOUNTER — Encounter: Payer: Self-pay | Admitting: Neurology

## 2016-02-27 ENCOUNTER — Ambulatory Visit (INDEPENDENT_AMBULATORY_CARE_PROVIDER_SITE_OTHER): Payer: Medicare Other | Admitting: Neurology

## 2016-02-27 VITALS — BP 152/70 | HR 82 | Resp 18 | Ht 75.0 in | Wt 244.0 lb

## 2016-02-27 DIAGNOSIS — E1040 Type 1 diabetes mellitus with diabetic neuropathy, unspecified: Secondary | ICD-10-CM | POA: Diagnosis not present

## 2016-02-27 DIAGNOSIS — E1021 Type 1 diabetes mellitus with diabetic nephropathy: Secondary | ICD-10-CM | POA: Diagnosis not present

## 2016-02-27 DIAGNOSIS — G4731 Primary central sleep apnea: Secondary | ICD-10-CM | POA: Diagnosis not present

## 2016-02-27 NOTE — Patient Instructions (Addendum)
Complex sleep apnea.  ASV user, compliant.  May start on a memory support medication next appointment

## 2016-02-27 NOTE — Progress Notes (Signed)
[facility]  Provider:  Larey Seat, M D  Referring Provider: Marton Redwood, MD Primary Care Physician:  Marton Redwood, MD  Chief Complaint  Patient presents with  . Follow-up    Rm 11. Patient states that he is doing well with CPAP.     HPI:  [name] is a 66 y.o. [place] born, caucasian, married  male -   He is seen here as a  revisit  from Dr. Lutricia Feil, his internist, for Sleep apnea  BiPAP-follow up. He has plans to visit the country of his birth and wants to make sure that BiPAP can be used on flight and at destination. He has a long history of DM type 1 .  Established patient - since 2012, right-handed married  gentleman, with a history of complex apnea.  Had headaches before treatment with BiPAP, his HbA1c have been in the low 6 range. Memory has declined. His high fatigue and Epworth scores remained high after BiPAP. He was tested for narcolepsy by HLA: positive ! He just left for a journey to [place] when we got these result, and we meet now after his return. He endorses today Epworth at 9, feels rested and restored. He feels that he can drive and is alert for hours, no sleep attacks.  Does he need medications ? No, or not yet. Explained the genetic and autoimmune barrier to narcolepsy.   Last visit note CD.   He had sleep complains of persistent excessive daytime sleepiness despite nightly use of CPAP associated with restless legs and snoring. The patient also has a long-standing history of type 1 diabetes. A split-night study was performed on 01-22-11 showed evidence of CPAP and was central apneas and the patient was asked to return to use a BiPAP with backup rate or an adaptive serval ventilation device. At the time in beverages doesn't 13 the patient endorsed the Epworth sleepiness score at 17/23 points and the backs inventory at 16 points his BMI was 30.2 his neck circumference 17.5 inches. The patient responded best to adapt SV is an EEP-  Expiratory pressure of 10 cm,  and pressure support of 4 cm minimum and 15 cm maximum. He did have problems is continued periodic limb movements at over 60 per hour and associated arousals at about 22 per hour. Sept 2014 download from his recent machine but showed residual AHI of 2.6,  thereby complete resolution of his apnea, he is using a minimum pressure support of 4 cm water maximum pressure support of 10 cm water and EEP at 10 cm.  Epworth 15 points, FSS 48,  GDS 2 .   Interval history for 08/29/2015, [name] is here today for his ASV compliance visit. He has been using his machine and automatic servo ventilation with an expiratory pressure support of 10 cm water and a minimum pressure support of 4 cm . Maximum of 10 cm water pressure and a residual AHI of 7.3,but  he does have high air leaks. The residual AHI consists of hypopneas.   His compliance is 100% with  8 hours and 23 minutes on average nightly use of nasal pillow interface.[name] His Epworth sleepiness score is endorsed at 12 points , his geriatric depression score is endorsed at 2 out of 15 points. He reports having word finding difficulties and at times what he calls " garbled speech" . He would be placed words with a sequence of sounds that makes sense to him but not to the  person listening to him, it is not a paraphasic error and it is not using a word that sounds alike. He noted delayed word finding and " pulling a blank ", has not gotten lost while driving. He feels at times a lack of focus.  I added today a Montral cognitive assessment the patient did excellent on visual spatial and executive function, recalled 3 of 5 words, and was able to generate 17 words. 27/30 points.  02-27-2016  [name] reports that he retreats for a nap when his level of fatigue becomes bothersome to him, he can work at his own pace,  He endorsed the geriatric depression score at 6 points, voicing that this merely reflect frustration. He does  not appear depressed he endorses again a high degree of fatigue at 54 points, and a high degree of excessive daytime sleepiness at 16 points, which I would expect given that he was age L a positive for narcolepsy. Today's Montral cognitive assessment 22-30 , a  lower point count than in the past- he reports he is sleepy , always in the early afternoon.  Compliance with CPAP is 100% the patient uses an a SV expiratory pressure is 10 cm water minimum pressure support for maximum pressure support 10 cm , residual AHI is 6.9 mainly determined by high air leaks. He does feel comfortable using CPAP take a nap without it. No reason to change any of the settings as he feels comfortable even with the airleak which undoubtedly causes some of these hypopneas to resurge.    Montreal Cognitive Assessment  02/27/2016 08/29/2015  Visuospatial/ Executive (0/5) 2 5  Naming (0/3) 3 3  Attention: Read list of digits (0/2) 2 1  Attention: Read list of letters (0/1) 1 1  Attention: Serial 7 subtraction starting at 100 (0/3) 3 3  Language: Repeat phrase (0/2) 1 2  Language : Fluency (0/1) 1 1  Abstraction (0/2) 2 2  Delayed Recall (0/5) 2 3  Orientation (0/6) 5 6  Total 22 27  Adjusted Score (based on education) 22 27    Montreal Cognitive Assessment  02/27/2016 08/29/2015  Visuospatial/ Executive (0/5) 2 5  Naming (0/3) 3 3  Attention: Read list of digits (0/2) 2 1  Attention: Read list of letters (0/1) 1 1  Attention: Serial 7 subtraction starting at 100 (0/3) 3 3  Language: Repeat phrase (0/2) 1 2  Language : Fluency (0/1) 1 1  Abstraction (0/2) 2 2  Delayed Recall (0/5) 2 3  Orientation (0/6) 5 6  Total 22 27  Adjusted Score (based on education) 27 27     Social History   Social History  . Marital status: Married    Spouse name: [name]  . Number of children: 1  . Years of education: 16   Occupational History  .      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 Smoking status: Never  Smoker  . Smokeless tobacco: Never Used  . Alcohol use No  . Drug use: No  . Sexual activity: Not on file   Other Topics Concern  . 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   Patient has one adult child.   Patient is working part-time and is retired.   Patient has a Haematologist.   Patient is right-handed.   Patient drinks four cups of coffee daily.    Family History  Problem Relation Age of Onset  . Arthritis Sister   . Sleep apnea Brother   . Diabetes Maternal Uncle   . Diabetes Other   . Heart failure Mother     Past Medical History:  Diagnosis Date  . Anxiety   . Arthritis   . Diabetes mellitus without complication ([place])   . Head injury 09/16/12   12 stitches  . Hypertension   . Migraine   . 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 OSA (obstructive sleep apnea)   . OSA treated with BiPAP   . Sleep apnea with use of continuous positive airway pressure (CPAP)    sleep study revealed centrals, adapt SV EPAP at 10 cm water 02-19-11     Past Surgical History:  Procedure Laterality Date  . COLON SURGERY  2010   collapsed colon  . TRIGGER FINGER RELEASE     x4    Current Outpatient Prescriptions  Medication Sig Dispense Refill  . amLODipine (NORVASC) 5 MG tablet Take 5 mg by mouth daily.    [name] CINNAMON PO Take 2 tablets by mouth daily.    . citalopram (CELEXA) 20 MG tablet Take 20 mg by mouth daily.    . cloNIDine (CATAPRES) 0.1 MG tablet Take 0.1 mg by mouth daily as needed (for high BP).    . fluocinonide cream (LIDEX) 0.05 %   2  . hydrochlorothiazide (MICROZIDE) 12.5 MG capsule Take 25 mg by mouth daily.     [name] ibuprofen (ADVIL,MOTRIN) 200 MG tablet Take 200-400 mg by mouth every 6 (six) hours as needed for pain.    . Insulin Human (INSULIN PUMP) 100 unit/ml SOLN Inject 70 each into the skin continuous. Humalog insulin    .  LISINOPRIL PO Take by mouth.    . Multiple Vitamin (MULTIVITAMIN WITH MINERALS) TABS tablet Take 1 tablet by mouth daily.    [name] omeprazole (PRILOSEC) 40 MG capsule Take by mouth.    Vladimir Faster Glycol-Propyl Glycol (SYSTANE) 0.4-0.3 % SOLN Apply 2 drops to eye 3 (three) times daily.    . vitamin C (ASCORBIC ACID) 500 MG tablet Take 500 mg by mouth daily.     No current facility-administered medications for this visit.     Allergies as of 02/27/2016 - Review Complete 02/27/2016  Allergen Reaction Noted  . Codeine Nausea And Vomiting 10/27/2011  . Dilaudid [hydromorphone hcl] Anaphylaxis 10/27/2011  . Gluten meal Other (See Comments) 09/16/2012  . Morphine and related Anaphylaxis 10/27/2011  . Toradol [ketorolac tromethamine] Nausea And Vomiting 10/27/2011  . Penicillins Other (See Comments) 10/27/2011    Vitals: BP (!) 152/70   Pulse 82   Resp 18   Ht 6' 3"  (1.905 m)   Wt 244 lb (110.7 kg)   BMI 30.50 kg/m  Last Weight:  Wt Readings from Last 1 Encounters:  02/27/16 244 lb (110.7 kg)   Last Height:   Ht Readings from Last 1 Encounters:  02/27/16 6' 3"  (1.905 m)    Physical exam:  General: The patient is awake, alert and appears not in acute distress. The patient is well groomed. Head: Normocephalic, No fall, no faint, no LOC.  Neck is supple. Mallampati 3 , neck circumference: 15. 5"  Neurologic exam :The patient is awake and alert, oriented to place and time.  Memory subjective described as impaired , short term memory primarily- he forgets apointments and parts of conversation.  Cranial nerves:Pupils are equal and  slowed  reactive to light. Funduscopic exam with  pallor, not edema. He underwent a laser surgery for retinopathy and an injury .  His vision has improved for acuity, no defiiciency in color perception.Extraocular movements  in vertical and horizontal planes intact and without nystagmus. Visual fields by finger perimetry are intact. Hearing to finger rub intact.   Facial sensation intact to fine touch. Facial motor strength is symmetric and tongue and uvula move midline. Motor exam:   Normal tone and normal muscle bulk and symmetric, attenuated -  He has shoulder movement limitations. ROM. normal strength in all extremities. 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  Coordination: Rapid alternating movements in the fingers/hands is normal.  Finger-to-nose maneuver :  noted dysmetria , not  tremor. Gait and station: Patient walks without assistive device .  Deep tendon reflexes: in the upper and lower extremities are attenauted, symmetric and intact. Babinski maneuver response is equivocal .   Assessment:  1) patient with complex apnea and HLA positive narcolepsy - he is still in doubt about the narcolepsy . Manages with scheduled naps. Works part time. Takes daytime naps.   2)  he feels a significant impairment in comparison to his younger self- doesn't like peer to peer comparison . MOCA now 22/30 - concerning for early demntia, at least MCI.   3) diabetic neuropathy in adult onset DM 1. Minimal retinopathy, but peripheral neuropathy.    Plan:  Treatment plan and additional workup :  continue ASV-PAP use with current mask. stimulant medication offered, he struggles with HTN and would like to hold off  MOCA , ever 6 month.   ASV follow up once a year.    Robert Belford, MD

## 2016-03-06 DIAGNOSIS — E1042 Type 1 diabetes mellitus with diabetic polyneuropathy: Secondary | ICD-10-CM | POA: Diagnosis not present

## 2016-03-13 DIAGNOSIS — E113593 Type 2 diabetes mellitus with proliferative diabetic retinopathy without macular edema, bilateral: Secondary | ICD-10-CM | POA: Diagnosis not present

## 2016-03-24 DIAGNOSIS — M5136 Other intervertebral disc degeneration, lumbar region: Secondary | ICD-10-CM | POA: Diagnosis not present

## 2016-03-24 DIAGNOSIS — M9903 Segmental and somatic dysfunction of lumbar region: Secondary | ICD-10-CM | POA: Diagnosis not present

## 2016-03-24 DIAGNOSIS — M5030 Other cervical disc degeneration, unspecified cervical region: Secondary | ICD-10-CM | POA: Diagnosis not present

## 2016-03-24 DIAGNOSIS — M9901 Segmental and somatic dysfunction of cervical region: Secondary | ICD-10-CM | POA: Diagnosis not present

## 2016-03-26 DIAGNOSIS — E1021 Type 1 diabetes mellitus with diabetic nephropathy: Secondary | ICD-10-CM | POA: Diagnosis not present

## 2016-04-07 DIAGNOSIS — E109 Type 1 diabetes mellitus without complications: Secondary | ICD-10-CM | POA: Diagnosis not present

## 2016-04-22 DIAGNOSIS — I10 Essential (primary) hypertension: Secondary | ICD-10-CM | POA: Diagnosis not present

## 2016-04-22 DIAGNOSIS — N183 Chronic kidney disease, stage 3 (moderate): Secondary | ICD-10-CM | POA: Diagnosis not present

## 2016-04-22 DIAGNOSIS — E119 Type 2 diabetes mellitus without complications: Secondary | ICD-10-CM | POA: Diagnosis not present

## 2016-04-28 DIAGNOSIS — M9904 Segmental and somatic dysfunction of sacral region: Secondary | ICD-10-CM | POA: Diagnosis not present

## 2016-04-28 DIAGNOSIS — M9903 Segmental and somatic dysfunction of lumbar region: Secondary | ICD-10-CM | POA: Diagnosis not present

## 2016-04-28 DIAGNOSIS — M5136 Other intervertebral disc degeneration, lumbar region: Secondary | ICD-10-CM | POA: Diagnosis not present

## 2016-04-28 DIAGNOSIS — E1022 Type 1 diabetes mellitus with diabetic chronic kidney disease: Secondary | ICD-10-CM | POA: Diagnosis not present

## 2016-04-28 DIAGNOSIS — E1042 Type 1 diabetes mellitus with diabetic polyneuropathy: Secondary | ICD-10-CM | POA: Diagnosis not present

## 2016-04-28 DIAGNOSIS — N183 Chronic kidney disease, stage 3 (moderate): Secondary | ICD-10-CM | POA: Diagnosis not present

## 2016-04-28 DIAGNOSIS — Z794 Long term (current) use of insulin: Secondary | ICD-10-CM | POA: Diagnosis not present

## 2016-04-28 DIAGNOSIS — M9901 Segmental and somatic dysfunction of cervical region: Secondary | ICD-10-CM | POA: Diagnosis not present

## 2016-05-02 DIAGNOSIS — M9901 Segmental and somatic dysfunction of cervical region: Secondary | ICD-10-CM | POA: Diagnosis not present

## 2016-05-02 DIAGNOSIS — M9904 Segmental and somatic dysfunction of sacral region: Secondary | ICD-10-CM | POA: Diagnosis not present

## 2016-05-02 DIAGNOSIS — M9903 Segmental and somatic dysfunction of lumbar region: Secondary | ICD-10-CM | POA: Diagnosis not present

## 2016-05-02 DIAGNOSIS — M5136 Other intervertebral disc degeneration, lumbar region: Secondary | ICD-10-CM | POA: Diagnosis not present

## 2016-05-07 DIAGNOSIS — E109 Type 1 diabetes mellitus without complications: Secondary | ICD-10-CM | POA: Diagnosis not present

## 2016-05-07 DIAGNOSIS — E1040 Type 1 diabetes mellitus with diabetic neuropathy, unspecified: Secondary | ICD-10-CM | POA: Diagnosis not present

## 2016-05-07 DIAGNOSIS — E1039 Type 1 diabetes mellitus with other diabetic ophthalmic complication: Secondary | ICD-10-CM | POA: Diagnosis not present

## 2016-05-07 DIAGNOSIS — G3184 Mild cognitive impairment, so stated: Secondary | ICD-10-CM | POA: Diagnosis not present

## 2016-05-07 DIAGNOSIS — E1029 Type 1 diabetes mellitus with other diabetic kidney complication: Secondary | ICD-10-CM | POA: Diagnosis not present

## 2016-05-12 DIAGNOSIS — M9904 Segmental and somatic dysfunction of sacral region: Secondary | ICD-10-CM | POA: Diagnosis not present

## 2016-05-12 DIAGNOSIS — M9901 Segmental and somatic dysfunction of cervical region: Secondary | ICD-10-CM | POA: Diagnosis not present

## 2016-05-12 DIAGNOSIS — M9903 Segmental and somatic dysfunction of lumbar region: Secondary | ICD-10-CM | POA: Diagnosis not present

## 2016-05-12 DIAGNOSIS — M5136 Other intervertebral disc degeneration, lumbar region: Secondary | ICD-10-CM | POA: Diagnosis not present

## 2016-06-05 DIAGNOSIS — E1022 Type 1 diabetes mellitus with diabetic chronic kidney disease: Secondary | ICD-10-CM | POA: Diagnosis not present

## 2016-06-05 DIAGNOSIS — E1021 Type 1 diabetes mellitus with diabetic nephropathy: Secondary | ICD-10-CM | POA: Diagnosis not present

## 2016-06-06 DIAGNOSIS — M9903 Segmental and somatic dysfunction of lumbar region: Secondary | ICD-10-CM | POA: Diagnosis not present

## 2016-06-06 DIAGNOSIS — M9901 Segmental and somatic dysfunction of cervical region: Secondary | ICD-10-CM | POA: Diagnosis not present

## 2016-06-06 DIAGNOSIS — E109 Type 1 diabetes mellitus without complications: Secondary | ICD-10-CM | POA: Diagnosis not present

## 2016-06-06 DIAGNOSIS — M9904 Segmental and somatic dysfunction of sacral region: Secondary | ICD-10-CM | POA: Diagnosis not present

## 2016-06-06 DIAGNOSIS — M5136 Other intervertebral disc degeneration, lumbar region: Secondary | ICD-10-CM | POA: Diagnosis not present

## 2016-06-09 DIAGNOSIS — E1042 Type 1 diabetes mellitus with diabetic polyneuropathy: Secondary | ICD-10-CM | POA: Diagnosis not present

## 2016-06-20 DIAGNOSIS — M9901 Segmental and somatic dysfunction of cervical region: Secondary | ICD-10-CM | POA: Diagnosis not present

## 2016-06-20 DIAGNOSIS — M9904 Segmental and somatic dysfunction of sacral region: Secondary | ICD-10-CM | POA: Diagnosis not present

## 2016-06-20 DIAGNOSIS — M9903 Segmental and somatic dysfunction of lumbar region: Secondary | ICD-10-CM | POA: Diagnosis not present

## 2016-06-20 DIAGNOSIS — M5136 Other intervertebral disc degeneration, lumbar region: Secondary | ICD-10-CM | POA: Diagnosis not present

## 2016-06-25 DIAGNOSIS — E1022 Type 1 diabetes mellitus with diabetic chronic kidney disease: Secondary | ICD-10-CM | POA: Diagnosis not present

## 2016-06-25 DIAGNOSIS — E1021 Type 1 diabetes mellitus with diabetic nephropathy: Secondary | ICD-10-CM | POA: Diagnosis not present

## 2016-07-08 DIAGNOSIS — E109 Type 1 diabetes mellitus without complications: Secondary | ICD-10-CM | POA: Diagnosis not present

## 2016-07-09 DIAGNOSIS — E1042 Type 1 diabetes mellitus with diabetic polyneuropathy: Secondary | ICD-10-CM | POA: Diagnosis not present

## 2016-07-11 DIAGNOSIS — M9903 Segmental and somatic dysfunction of lumbar region: Secondary | ICD-10-CM | POA: Diagnosis not present

## 2016-07-11 DIAGNOSIS — M9901 Segmental and somatic dysfunction of cervical region: Secondary | ICD-10-CM | POA: Diagnosis not present

## 2016-07-11 DIAGNOSIS — M5136 Other intervertebral disc degeneration, lumbar region: Secondary | ICD-10-CM | POA: Diagnosis not present

## 2016-07-11 DIAGNOSIS — M9904 Segmental and somatic dysfunction of sacral region: Secondary | ICD-10-CM | POA: Diagnosis not present

## 2016-07-29 DIAGNOSIS — L570 Actinic keratosis: Secondary | ICD-10-CM | POA: Diagnosis not present

## 2016-07-29 DIAGNOSIS — L821 Other seborrheic keratosis: Secondary | ICD-10-CM | POA: Diagnosis not present

## 2016-07-29 DIAGNOSIS — Z85828 Personal history of other malignant neoplasm of skin: Secondary | ICD-10-CM | POA: Diagnosis not present

## 2016-07-29 DIAGNOSIS — L814 Other melanin hyperpigmentation: Secondary | ICD-10-CM | POA: Diagnosis not present

## 2016-07-29 DIAGNOSIS — D1801 Hemangioma of skin and subcutaneous tissue: Secondary | ICD-10-CM | POA: Diagnosis not present

## 2016-07-30 DIAGNOSIS — Z794 Long term (current) use of insulin: Secondary | ICD-10-CM | POA: Diagnosis not present

## 2016-07-30 DIAGNOSIS — E1022 Type 1 diabetes mellitus with diabetic chronic kidney disease: Secondary | ICD-10-CM | POA: Diagnosis not present

## 2016-07-30 DIAGNOSIS — E1042 Type 1 diabetes mellitus with diabetic polyneuropathy: Secondary | ICD-10-CM | POA: Diagnosis not present

## 2016-07-30 DIAGNOSIS — N183 Chronic kidney disease, stage 3 (moderate): Secondary | ICD-10-CM | POA: Diagnosis not present

## 2016-08-04 DIAGNOSIS — Z85828 Personal history of other malignant neoplasm of skin: Secondary | ICD-10-CM | POA: Diagnosis not present

## 2016-08-04 DIAGNOSIS — L570 Actinic keratosis: Secondary | ICD-10-CM | POA: Diagnosis not present

## 2016-08-13 DIAGNOSIS — E1022 Type 1 diabetes mellitus with diabetic chronic kidney disease: Secondary | ICD-10-CM | POA: Diagnosis not present

## 2016-08-19 ENCOUNTER — Ambulatory Visit: Payer: Self-pay | Admitting: *Deleted

## 2016-08-20 DIAGNOSIS — M9901 Segmental and somatic dysfunction of cervical region: Secondary | ICD-10-CM | POA: Diagnosis not present

## 2016-08-20 DIAGNOSIS — M5030 Other cervical disc degeneration, unspecified cervical region: Secondary | ICD-10-CM | POA: Diagnosis not present

## 2016-08-20 DIAGNOSIS — M9903 Segmental and somatic dysfunction of lumbar region: Secondary | ICD-10-CM | POA: Diagnosis not present

## 2016-08-20 DIAGNOSIS — M5136 Other intervertebral disc degeneration, lumbar region: Secondary | ICD-10-CM | POA: Diagnosis not present

## 2016-08-25 ENCOUNTER — Encounter: Payer: Self-pay | Admitting: Neurology

## 2016-08-26 ENCOUNTER — Encounter: Payer: Self-pay | Admitting: Neurology

## 2016-08-26 ENCOUNTER — Ambulatory Visit (INDEPENDENT_AMBULATORY_CARE_PROVIDER_SITE_OTHER): Payer: Medicare Other | Admitting: Neurology

## 2016-08-26 ENCOUNTER — Encounter (INDEPENDENT_AMBULATORY_CARE_PROVIDER_SITE_OTHER): Payer: Self-pay

## 2016-08-26 VITALS — BP 154/83 | HR 78 | Ht 75.0 in | Wt 244.0 lb

## 2016-08-26 DIAGNOSIS — G3184 Mild cognitive impairment, so stated: Secondary | ICD-10-CM | POA: Diagnosis not present

## 2016-08-26 DIAGNOSIS — G4731 Primary central sleep apnea: Secondary | ICD-10-CM | POA: Diagnosis not present

## 2016-08-26 DIAGNOSIS — G47411 Narcolepsy with cataplexy: Secondary | ICD-10-CM

## 2016-08-26 DIAGNOSIS — R9082 White matter disease, unspecified: Secondary | ICD-10-CM | POA: Insufficient documentation

## 2016-08-26 NOTE — Progress Notes (Signed)
[facility]  Provider:  Larey Seat, M D  Referring Provider: Marton Redwood, MD Primary Care Physician:  Marton Redwood, MD  Chief Complaint  Patient presents with  . Follow-up    pt states everything has been going.     HPI:  08-26-2016 , [name] is a 66 y.o. [place] born, caucasian, married male -  He is seen here as a  revisit  from Dr. Lutricia Feil, his internist, for Sleep apnea and BiPAP- therapy follow up. [name] is a highly compliant user of ASV, expiratory pressure 10 cm water, minimum pressure support 4, maximum pressure support 10 cm water he has a residual hypotony index of 7.7 which is higher than in the past 12 months. However he still has much better resolution of apnea in spite of high air leaks than he had untreated. I do think that air leaks cause some of his respiratory events. His minute ventilation, respiratory rate and tidal volume have not varied greatly. Since air leaks seem to be the main problem here, I do not need to adjust his settings. The patient's Epworth sleepiness score today was endorsed at 9 points, fatigue severity at 43 and geriatric depression score 3 out of 15 points. This all is better than anytime else in the last 18 months.  He still reports memory decline-  And he is not accepting the diagnosis of narcolepsy. Next visit in 6 month with MMSE or MOCA.  He had the following questions; [name] is concerned that his water reservoir's bottom seems to be deteriorating, may be rusting. The patient is followed by advanced home [facility]. I for him to make an appointment and show his machine to the respiratory tech.    02-2016 CD He has plans to visit the country of his birth and wants to make sure that BiPAP can be used on flight and at destination. He has a long history of DM type 1 .  Established patient - since 2012, right-handed married  gentleman, with a history of complex apnea.  Had headaches before treatment with  BiPAP, his HbA1c have been in the low 6 range. Memory has declined. His high fatigue and Epworth scores remained high after BiPAP. He was tested for narcolepsy by HLA: positive ! He just left for a journey to [place] when we got these result, and we meet now after his return. He endorses today Epworth at 9, feels rested and restored. He feels that he can drive and is alert for hours, no sleep attacks.  Does he need medications ? No, or not yet. Explained the genetic and autoimmune barrier to narcolepsy.   Last visit note CD.   He had sleep complains of persistent excessive daytime sleepiness despite nightly use of CPAP associated with restless legs and snoring. The patient also has a long-standing history of type 1 diabetes. A split-night study was performed on 01-22-11 showed evidence of CPAP and was central apneas and the patient was asked to return to use a BiPAP with backup rate or an adaptive serval ventilation device. At the time in beverages doesn't 13 the patient endorsed the Epworth sleepiness score at 17/23 points and the backs inventory at 16 points his BMI was 30.2 his neck circumference 17.5 inches. The patient responded best to adapt SV is an EEP- Expiratory pressure of 10 cm,  and pressure support of 4 cm minimum and 15 cm maximum. He did have problems is continued periodic limb movements at over 60 per  hour and associated arousals at about 22 per hour. Sept 2014 download from his recent machine but showed residual AHI of 2.6,  thereby complete resolution of his apnea, he is using a minimum pressure support of 4 cm water maximum pressure support of 10 cm water and EEP at 10 cm.  Epworth 15 points, FSS 48,  GDS 2 .   Interval history for 08/29/2015, [name] is here today for his ASV compliance visit. He has been using his machine and automatic servo ventilation with an expiratory pressure support of 10 cm water and a minimum pressure support of 4 cm . Maximum of 10 cm water  pressure and a residual AHI of 7.3,but he does have high air leaks. The residual AHI consists of hypopneas.   His compliance is 100% with  8 hours and 23 minutes on average nightly use of nasal pillow interface.[name] His Epworth sleepiness score is endorsed at 12 points , his geriatric depression score is endorsed at 2 out of 15 points.  He reports having word finding difficulties and at times what he calls " garbled speech" . He would be placed words with a sequence of sounds that makes sense to him but not to the person listening to him, it is not a paraphasic error and it is not using a word that sounds alike. He noted delayed word finding and " pulling a blank ", has not gotten lost while driving. He feels at times a lack of focus.  I added today a Montral cognitive assessment the patient did excellent on visual spatial and executive function, recalled 3 of 5 words, and was able to generate 17 words. 27/30 points.  02-27-2016  [name] reports that he retreats for a nap when his level of fatigue becomes bothersome to him, he can work at his own pace,  He endorsed the geriatric depression score at 6 points, voicing that this merely reflect frustration. He does not appear depressed he endorses again a high degree of fatigue at 54 points, and a high degree of excessive daytime sleepiness at 16 points, which I would expect given that he was age L a positive for narcolepsy. Today's Montral cognitive assessment 22-30 , a  lower point count than in the past- he reports he is sleepy , always in the early afternoon.  Compliance with CPAP is 100% the patient uses an a SV expiratory pressure is 10 cm water minimum pressure support for maximum pressure support 10 cm , residual AHI is 6.9 mainly determined by high air leaks. He does feel comfortable using CPAP take a nap without it. No reason to change any of the settings as he feels comfortable even with the airleak which undoubtedly causes some of these  hypopneas to resurge.    Montreal Cognitive Assessment  02/27/2016 08/29/2015  Visuospatial/ Executive (0/5) 2 5  Naming (0/3) 3 3  Attention: Read list of digits (0/2) 2 1  Attention: Read list of letters (0/1) 1 1  Attention: Serial 7 subtraction starting at 100 (0/3) 3 3  Language: Repeat phrase (0/2) 1 2  Language : Fluency (0/1) 1 1  Abstraction (0/2) 2 2  Delayed Recall (0/5) 2 3  Orientation (0/6) 5 6  Total 22 27  Adjusted Score (based on education) 22 27    Montreal Cognitive Assessment  02/27/2016 08/29/2015  Visuospatial/ Executive (0/5) 2 5  Naming (0/3) 3 3  Attention: Read list of digits (0/2) 2 1  Attention: Read list of letters (  0/1) 1 1  Attention: Serial 7 subtraction starting at 100 (0/3) 3 3  Language: Repeat phrase (0/2) 1 2  Language : Fluency (0/1) 1 1  Abstraction (0/2) 2 2  Delayed Recall (0/5) 2 3  Orientation (0/6) 5 6  Total 22 27  Adjusted Score (based on education) 52 27     Social History   Social History  . Marital status: Married    Spouse name: [name]  . Number of children: 1  . Years of education: 78   Occupational History  .      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 Smoking status: Never Smoker  . Smokeless tobacco: Never Used  . Alcohol use No  . Drug use: No  . Sexual activity: Not on file   Other Topics Concern  . 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   Patient has one adult child.   Patient is working part-time and is retired.   Patient has a Haematologist.   Patient is right-handed.   Patient drinks four cups of coffee daily.    Family History  Problem Relation Age of Onset  . Arthritis Sister   . Sleep apnea Brother   . Diabetes Maternal Uncle   . Diabetes Other   . Heart failure Mother     Past Medical History:  Diagnosis Date  . Anxiety   . Arthritis   . Diabetes mellitus without complication ([place])   . Head injury  09/16/12   12 stitches  . Hypertension   . Migraine   . 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 OSA (obstructive sleep apnea)   . OSA treated with BiPAP   . Sleep apnea with use of continuous positive airway pressure (CPAP)    sleep study revealed centrals, adapt SV EPAP at 10 cm water 02-19-11     Past Surgical History:  Procedure Laterality Date  . COLON SURGERY  2010   collapsed colon  . TRIGGER FINGER RELEASE     x4    Current Outpatient Prescriptions  Medication Sig Dispense Refill  . amLODipine (NORVASC) 5 MG tablet Take 5 mg by mouth daily.    [name] CINNAMON PO Take 2 tablets by mouth daily.    . citalopram (CELEXA) 20 MG tablet Take 20 mg by mouth daily.    . cloNIDine (CATAPRES) 0.1 MG tablet Take 0.1 mg by mouth daily as needed (for high BP).    . fluocinonide cream (LIDEX) 0.05 %   2  . hydrochlorothiazide (MICROZIDE) 12.5 MG capsule Take 25 mg by mouth daily.     [name] ibuprofen (ADVIL,MOTRIN) 200 MG tablet Take 200-400 mg by mouth every 6 (six) hours as needed for pain.    . Insulin Human (INSULIN PUMP) 100 unit/ml SOLN Inject 70 each into the skin continuous. Humalog insulin    . LISINOPRIL PO Take by mouth.    . Multiple Vitamin (MULTIVITAMIN WITH MINERALS) TABS tablet Take 1 tablet by mouth daily.    [name] omeprazole (PRILOSEC) 40 MG capsule Take by mouth.    Vladimir Faster Glycol-Propyl Glycol (SYSTANE) 0.4-0.3 % SOLN Apply 2 drops to eye 3 (three) times daily.    . vitamin C (ASCORBIC ACID) 500 MG tablet Take 500 mg by mouth daily.     No current facility-administered medications for this  visit.     Allergies as of 08/26/2016 - Review Complete 08/26/2016  Allergen Reaction Noted  . Codeine Nausea And Vomiting 10/27/2011  . Dilaudid [hydromorphone hcl] Anaphylaxis 10/27/2011  . Gluten meal Other (See Comments) 09/16/2012  . Morphine and related Anaphylaxis 10/27/2011  . Toradol [ketorolac tromethamine]  Nausea And Vomiting 10/27/2011  . Penicillins Other (See Comments) 10/27/2011    Vitals: BP (!) 154/83   Pulse 78   Ht 6' 3"  (1.905 m)   Wt 244 lb (110.7 kg)   BMI 30.50 kg/m  Last Weight:  Wt Readings from Last 1 Encounters:  08/26/16 244 lb (110.7 kg)   Last Height:   Ht Readings from Last 1 Encounters:  08/26/16 6' 3"  (1.905 m)    Physical exam:  General: The patient is awake, alert and appears not in acute distress. The patient is well groomed. Head: Normocephalic, No fall, no faint, no LOC.  Neck is supple. Mallampati 3 , neck circumference: 15. 5"  Neurologic exam :The patient is awake and alert, oriented to place and time.   Memory subjective described as impaired , short term memory primarily- he forgets apointments and parts of conversation, now struggles for words. .  Cranial nerves:Pupils are equally slowly reactive to light.  He underwent a laser surgery for retinopathy and an injury .  His vision has improved for acuity, no defiiciency in color perception.Extraocular movements  in vertical and horizontal planes intact and without nystagmus. Visual fields by finger perimetry are intact. Hearing to finger rub intact.  Facial motor strength is symmetric and tongue and uvula move midline. Motor exam:   Normal tone and normal muscle bulk and symmetric, attenuated -  He has shoulder movement limitations. ROM. normal strength in all extremities. 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  Unable to feel vibration and filament touch below the ankles bilaterally, skin is shiny , dystrophic over both feet.  Coordination: Rapid alternating movements in the fingers/hands is normal.  Finger-to-nose maneuver :  noted dysmetria , not  tremor. Gait and station: Patient walks without assistive device .  Deep tendon reflexes: in the upper and lower extremities are attenauted, symmetric and intact. Babinski maneuver response is  equivocal .   Assessment:  1) patient with complex apnea and HLA positive for narcolepsy - he is still in doubt about the narcolepsy . Manages with scheduled naps. Works part time. Takes daytime naps.   2)  he feels a significant impairment in comparison to his younger self- doesn't like peer to peer comparison . MOCA now 22/30 - concerning for early demntia, at least MCI.  He is longtime diabetic and at high risk for cerebral  microvascular disease.   3) diabetic neuropathy in adult onset DM 1. Minimal retinopathy, but peripheral neuropathy.    Plan:  Treatment plan and additional workup : Complex sleep apnea- and excessive daytime sleepiness. patient uses ASV, and has positive HLA test.  continue ASV-PAP use with current mask. stimulant medication offered, he struggles with HTN and would still like to hold off. He doesn't drive when tired, and not long distance.  MOCA , every 6 month.  Next visit with NP . ASV follow up once a year.   Consider MRI brain q 3 years - he has an insulin pump Larey Seat, MD

## 2016-09-16 DIAGNOSIS — E1022 Type 1 diabetes mellitus with diabetic chronic kidney disease: Secondary | ICD-10-CM | POA: Diagnosis not present

## 2016-09-16 DIAGNOSIS — E109 Type 1 diabetes mellitus without complications: Secondary | ICD-10-CM | POA: Diagnosis not present

## 2016-09-17 DIAGNOSIS — M9901 Segmental and somatic dysfunction of cervical region: Secondary | ICD-10-CM | POA: Diagnosis not present

## 2016-09-17 DIAGNOSIS — H33011 Retinal detachment with single break, right eye: Secondary | ICD-10-CM | POA: Diagnosis not present

## 2016-09-17 DIAGNOSIS — E103593 Type 1 diabetes mellitus with proliferative diabetic retinopathy without macular edema, bilateral: Secondary | ICD-10-CM | POA: Diagnosis not present

## 2016-09-17 DIAGNOSIS — M5136 Other intervertebral disc degeneration, lumbar region: Secondary | ICD-10-CM | POA: Diagnosis not present

## 2016-09-17 DIAGNOSIS — M9903 Segmental and somatic dysfunction of lumbar region: Secondary | ICD-10-CM | POA: Diagnosis not present

## 2016-09-17 DIAGNOSIS — M5030 Other cervical disc degeneration, unspecified cervical region: Secondary | ICD-10-CM | POA: Diagnosis not present

## 2016-09-25 DIAGNOSIS — E1021 Type 1 diabetes mellitus with diabetic nephropathy: Secondary | ICD-10-CM | POA: Diagnosis not present

## 2016-09-25 DIAGNOSIS — E1022 Type 1 diabetes mellitus with diabetic chronic kidney disease: Secondary | ICD-10-CM | POA: Diagnosis not present

## 2016-10-06 DIAGNOSIS — M5136 Other intervertebral disc degeneration, lumbar region: Secondary | ICD-10-CM | POA: Diagnosis not present

## 2016-10-06 DIAGNOSIS — M9903 Segmental and somatic dysfunction of lumbar region: Secondary | ICD-10-CM | POA: Diagnosis not present

## 2016-10-06 DIAGNOSIS — M9901 Segmental and somatic dysfunction of cervical region: Secondary | ICD-10-CM | POA: Diagnosis not present

## 2016-10-06 DIAGNOSIS — M5030 Other cervical disc degeneration, unspecified cervical region: Secondary | ICD-10-CM | POA: Diagnosis not present

## 2016-10-17 DIAGNOSIS — M5136 Other intervertebral disc degeneration, lumbar region: Secondary | ICD-10-CM | POA: Diagnosis not present

## 2016-10-17 DIAGNOSIS — E1022 Type 1 diabetes mellitus with diabetic chronic kidney disease: Secondary | ICD-10-CM | POA: Diagnosis not present

## 2016-10-17 DIAGNOSIS — M9901 Segmental and somatic dysfunction of cervical region: Secondary | ICD-10-CM | POA: Diagnosis not present

## 2016-10-17 DIAGNOSIS — M5030 Other cervical disc degeneration, unspecified cervical region: Secondary | ICD-10-CM | POA: Diagnosis not present

## 2016-10-17 DIAGNOSIS — M9903 Segmental and somatic dysfunction of lumbar region: Secondary | ICD-10-CM | POA: Diagnosis not present

## 2016-11-05 DIAGNOSIS — I10 Essential (primary) hypertension: Secondary | ICD-10-CM | POA: Diagnosis not present

## 2016-11-05 DIAGNOSIS — E1029 Type 1 diabetes mellitus with other diabetic kidney complication: Secondary | ICD-10-CM | POA: Diagnosis not present

## 2016-11-05 DIAGNOSIS — Z125 Encounter for screening for malignant neoplasm of prostate: Secondary | ICD-10-CM | POA: Diagnosis not present

## 2016-11-05 DIAGNOSIS — E7849 Other hyperlipidemia: Secondary | ICD-10-CM | POA: Diagnosis not present

## 2016-11-10 DIAGNOSIS — M9901 Segmental and somatic dysfunction of cervical region: Secondary | ICD-10-CM | POA: Diagnosis not present

## 2016-11-10 DIAGNOSIS — M5136 Other intervertebral disc degeneration, lumbar region: Secondary | ICD-10-CM | POA: Diagnosis not present

## 2016-11-10 DIAGNOSIS — M5030 Other cervical disc degeneration, unspecified cervical region: Secondary | ICD-10-CM | POA: Diagnosis not present

## 2016-11-10 DIAGNOSIS — M9903 Segmental and somatic dysfunction of lumbar region: Secondary | ICD-10-CM | POA: Diagnosis not present

## 2016-11-12 DIAGNOSIS — Z0000 Encounter for general adult medical examination without abnormal findings: Secondary | ICD-10-CM | POA: Diagnosis not present

## 2016-11-12 DIAGNOSIS — I10 Essential (primary) hypertension: Secondary | ICD-10-CM | POA: Diagnosis not present

## 2016-11-12 DIAGNOSIS — E7849 Other hyperlipidemia: Secondary | ICD-10-CM | POA: Diagnosis not present

## 2016-11-12 DIAGNOSIS — E1029 Type 1 diabetes mellitus with other diabetic kidney complication: Secondary | ICD-10-CM | POA: Diagnosis not present

## 2016-11-14 DIAGNOSIS — M9903 Segmental and somatic dysfunction of lumbar region: Secondary | ICD-10-CM | POA: Diagnosis not present

## 2016-11-14 DIAGNOSIS — M5030 Other cervical disc degeneration, unspecified cervical region: Secondary | ICD-10-CM | POA: Diagnosis not present

## 2016-11-14 DIAGNOSIS — M5136 Other intervertebral disc degeneration, lumbar region: Secondary | ICD-10-CM | POA: Diagnosis not present

## 2016-11-14 DIAGNOSIS — M9901 Segmental and somatic dysfunction of cervical region: Secondary | ICD-10-CM | POA: Diagnosis not present

## 2016-11-15 DIAGNOSIS — E1022 Type 1 diabetes mellitus with diabetic chronic kidney disease: Secondary | ICD-10-CM | POA: Diagnosis not present

## 2016-12-01 DIAGNOSIS — E1042 Type 1 diabetes mellitus with diabetic polyneuropathy: Secondary | ICD-10-CM | POA: Diagnosis not present

## 2016-12-01 DIAGNOSIS — N183 Chronic kidney disease, stage 3 (moderate): Secondary | ICD-10-CM | POA: Diagnosis not present

## 2016-12-01 DIAGNOSIS — E1022 Type 1 diabetes mellitus with diabetic chronic kidney disease: Secondary | ICD-10-CM | POA: Diagnosis not present

## 2016-12-01 DIAGNOSIS — Z794 Long term (current) use of insulin: Secondary | ICD-10-CM | POA: Diagnosis not present

## 2016-12-08 DIAGNOSIS — M9903 Segmental and somatic dysfunction of lumbar region: Secondary | ICD-10-CM | POA: Diagnosis not present

## 2016-12-08 DIAGNOSIS — M5030 Other cervical disc degeneration, unspecified cervical region: Secondary | ICD-10-CM | POA: Diagnosis not present

## 2016-12-08 DIAGNOSIS — M9901 Segmental and somatic dysfunction of cervical region: Secondary | ICD-10-CM | POA: Diagnosis not present

## 2016-12-08 DIAGNOSIS — M5136 Other intervertebral disc degeneration, lumbar region: Secondary | ICD-10-CM | POA: Diagnosis not present

## 2016-12-14 DIAGNOSIS — E1022 Type 1 diabetes mellitus with diabetic chronic kidney disease: Secondary | ICD-10-CM | POA: Diagnosis not present

## 2016-12-25 DIAGNOSIS — E1022 Type 1 diabetes mellitus with diabetic chronic kidney disease: Secondary | ICD-10-CM | POA: Diagnosis not present

## 2016-12-25 DIAGNOSIS — E1021 Type 1 diabetes mellitus with diabetic nephropathy: Secondary | ICD-10-CM | POA: Diagnosis not present

## 2016-12-26 DIAGNOSIS — E109 Type 1 diabetes mellitus without complications: Secondary | ICD-10-CM | POA: Diagnosis not present

## 2017-01-02 DIAGNOSIS — M9903 Segmental and somatic dysfunction of lumbar region: Secondary | ICD-10-CM | POA: Diagnosis not present

## 2017-01-02 DIAGNOSIS — M9901 Segmental and somatic dysfunction of cervical region: Secondary | ICD-10-CM | POA: Diagnosis not present

## 2017-01-02 DIAGNOSIS — M5136 Other intervertebral disc degeneration, lumbar region: Secondary | ICD-10-CM | POA: Diagnosis not present

## 2017-01-02 DIAGNOSIS — M5030 Other cervical disc degeneration, unspecified cervical region: Secondary | ICD-10-CM | POA: Diagnosis not present

## 2017-01-12 DIAGNOSIS — E1022 Type 1 diabetes mellitus with diabetic chronic kidney disease: Secondary | ICD-10-CM | POA: Diagnosis not present

## 2017-02-02 DIAGNOSIS — D225 Melanocytic nevi of trunk: Secondary | ICD-10-CM | POA: Diagnosis not present

## 2017-02-02 DIAGNOSIS — Z85828 Personal history of other malignant neoplasm of skin: Secondary | ICD-10-CM | POA: Diagnosis not present

## 2017-02-02 DIAGNOSIS — L570 Actinic keratosis: Secondary | ICD-10-CM | POA: Diagnosis not present

## 2017-02-02 DIAGNOSIS — L814 Other melanin hyperpigmentation: Secondary | ICD-10-CM | POA: Diagnosis not present

## 2017-02-02 DIAGNOSIS — L821 Other seborrheic keratosis: Secondary | ICD-10-CM | POA: Diagnosis not present

## 2017-02-26 ENCOUNTER — Encounter: Payer: Self-pay | Admitting: Adult Health

## 2017-02-26 ENCOUNTER — Ambulatory Visit: Payer: Medicare Other | Admitting: Adult Health

## 2017-02-26 VITALS — BP 157/83 | HR 91 | Wt 248.4 lb

## 2017-02-26 DIAGNOSIS — G4733 Obstructive sleep apnea (adult) (pediatric): Secondary | ICD-10-CM | POA: Diagnosis not present

## 2017-02-26 DIAGNOSIS — R413 Other amnesia: Secondary | ICD-10-CM | POA: Diagnosis not present

## 2017-02-26 NOTE — Progress Notes (Signed)
PATIENT: [name] DOB: [birth year]  REASON FOR VISIT: follow up HISTORY FROM: patient  HISTORY OF PRESENT ILLNESS: Today 02/26/17  [name] is a 67 year old male with a history of complex sleep apnea.  He returns today for follow-up.  His download indicates that he use his machine 30 out of 30 days for compliance of 100%.  He uses machine greater than 4 hours each night.  On average he uses his machine 8 hours and 59 minutes.  His residual AHI is 5.7.  He is on EPAP of 10 cm of water and Min PS of 4 cm of water and Max PS of 10 cm of water.  His leak in the 95th percentile is 63.8 L/min.  The patient states that his mask tends to slip off during the night.  He states that sometimes this does not even wake him up.  He reports that he continues to have trouble with his memory.  He reports that he often has trouble with word finding.  He will forget things that are immediately told him within sometimes will recall it later.  He is able to complete all ADLs independently.  He lives at home with his spouse.  He returns today for evaluation.  HISTORY 08-26-2016 , [name] is a 67 y.o. [place] born, caucasian, married male -  He is seen here as a  revisit  from Dr. Lutricia Feil, his internist, for Sleep apnea and BiPAP- therapy follow up. [name] is a highly compliant user of ASV, expiratory pressure 10 cm water, minimum pressure support 4, maximum pressure support 10 cm water he has a residual hypotony index of 7.7 which is higher than in the past 12 months. However he still has much better resolution of apnea in spite of high air leaks than he had untreated. I do think that air leaks cause some of his respiratory events. His minute ventilation, respiratory rate and tidal volume have not varied greatly. Since air leaks seem to be the main problem here, I do not need to adjust his settings. The patient's Epworth sleepiness score today was endorsed at 9 points, fatigue severity at 43  and geriatric depression score 3 out of 15 points. This all is better than anytime else in the last 18 months.  He still reports memory decline-  And he is not accepting the diagnosis of narcolepsy. Next visit in 6 month with MMSE or MOCA.  He had the following questions; [name] is concerned that his water reservoir's bottom seems to be deteriorating, may be rusting. The patient is followed by [facility]. I for him to make an appointment and show his machine to the respiratory tech.  REVIEW OF SYSTEMS: Out of a complete 14 system review of symptoms, the patient complains only of the following symptoms, and all other reviewed systems are negative.  See HPI  ALLERGIES: Allergies  Allergen Reactions  . Codeine Nausea And Vomiting    Last time, came to ED  . Dilaudid [Hydromorphone Hcl] Anaphylaxis    Patient goes into cardiac arrest  . Gluten Meal Other (See Comments)    Severe migraines  . Morphine And Related Anaphylaxis  . Toradol [Ketorolac Tromethamine] Nausea And Vomiting  . Penicillins Other (See Comments)    Unknown     HOME MEDICATIONS: Outpatient Medications Prior to Visit  Medication Sig Dispense Refill  . amLODipine (NORVASC) 5 MG tablet Take 5 mg by mouth daily.    [name] CINNAMON PO  Take 2 tablets by mouth daily.    . citalopram (CELEXA) 20 MG tablet Take 20 mg by mouth daily.    . cloNIDine (CATAPRES) 0.1 MG tablet Take 0.1 mg by mouth daily as needed (for high BP).    . fluocinonide cream (LIDEX) 0.05 %   2  . hydrochlorothiazide (MICROZIDE) 12.5 MG capsule Take 25 mg by mouth daily.     . ibuprofen (ADVIL,MOTRIN) 200 MG tablet Take 200-400 mg by mouth every 6 (six) hours as needed for pain.    . Insulin Human (INSULIN PUMP) 100 unit/ml SOLN Inject 70 each into the skin continuous. Humalog insulin    . LISINOPRIL PO Take by mouth.    . Multiple Vitamin (MULTIVITAMIN WITH MINERALS) TABS tablet Take 1 tablet by mouth daily.    . omeprazole (PRILOSEC) 40 MG  capsule Take by mouth.    . Polyethyl Glycol-Propyl Glycol (SYSTANE) 0.4-0.3 % SOLN Apply 2 drops to eye 3 (three) times daily.    . vitamin C (ASCORBIC ACID) 500 MG tablet Take 500 mg by mouth daily.     No facility-administered medications prior to visit.     PAST MEDICAL HISTORY: Past Medical History:  Diagnosis Date  . Anxiety   . Arthritis   . Diabetes mellitus without complication (HCC)   . Head injury 09/16/12   12 stitches  . Hypertension   . Migraine   . 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 OSA (obstructive sleep apnea)   . OSA treated with BiPAP   . Sleep apnea with use of continuous positive airway pressure (CPAP)    sleep study revealed centrals, adapt SV EPAP at 10 cm water 02-19-11     PAST SURGICAL HISTORY: Past Surgical History:  Procedure Laterality Date  . COLON SURGERY  2010   collapsed colon  . TRIGGER FINGER RELEASE     x4    FAMILY HISTORY: Family History  Problem Relation Age of Onset  . Arthritis Sister   . Sleep apnea Brother   . Diabetes Other   . Heart failure Mother   . Diabetes Maternal Uncle     SOCIAL HISTORY: Social History   Socioeconomic History  . Marital status: Married    Spouse name: [name]  . Number of children: 1  . Years of education: 22  . Highest education level: Not on file  Social Needs  . Financial resource strain: Not on file  . Food insecurity - worry: Not on file  . Food insecurity - inability: Not on file  . Transportation needs - medical: Not on file  . 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 Smoking status: Never Smoker  . Smokeless tobacco: Never Used  Substance and Sexual Activity  . Alcohol use: No  . Drug use: No  . Sexual activity: Not on file  Other Topics Concern  . Not on file  Social History Narrative   Patient is married ([name]) and lives  at home with his wife.   Patient has one adult child.   Patient is working part-time and is retired.   Patient has a Bachelors degree.   Patient is right-handed.   Patient drinks four cups of coffee daily.      PHYSICAL EXAM  Vitals:   02/26/17 1400  BP: (!) 157/83  Pulse: 91  Weight: 248 lb   6.4 oz (112.7 kg)   Body mass index is 31.05 kg/m.   Montreal Cognitive Assessment  02/26/2017 02/27/2016 08/29/2015  Visuospatial/ Executive (0/5) 3 2 5  Naming (0/3) 3 3 3  Attention: Read list of digits (0/2) 2 2 1  Attention: Read list of letters (0/1) 1 1 1  Attention: Serial 7 subtraction starting at 100 (0/3) 3 3 3  Language: Repeat phrase (0/2) 1 1 2  Language : Fluency (0/1) 1 1 1  Abstraction (0/2) 2 2 2  Delayed Recall (0/5) 0 2 3  Orientation (0/6) 5 5 6  Total 21 22 27  Adjusted Score (based on education) - 22 27     Generalized: Well developed, in no acute distress   Neurological examination  Mentation: Alert oriented to time, place, history taking. Follows all commands speech and language fluent Cranial nerve II-XII: Pupils were equal round reactive to light. Extraocular movements were full, visual field were full on confrontational test. Facial sensation and strength were normal. Uvula tongue midline. Head turning and shoulder shrug  were normal and symmetric. Motor: The motor testing reveals 5 over 5 strength of all 4 extremities. Good symmetric motor tone is noted throughout.  Sensory: Sensory testing is intact to soft touch on all 4 extremities. No evidence of extinction is noted.  Coordination: Cerebellar testing reveals good finger-nose-finger and heel-to-shin bilaterally.  Gait and station: Gait is normal.  Reflexes: Deep tendon reflexes are symmetric and normal bilaterally.   DIAGNOSTIC DATA (LABS, IMAGING, TESTING) - I reviewed patient records, labs, notes, testing and imaging myself where available.       ASSESSMENT AND PLAN 67 y.o. year old male  has  a past medical history of Anxiety, Arthritis, Diabetes mellitus without complication (HCC), Head injury (09/16/12), Hypertension, Migraine, Narcolepsy without cataplexy with hypocretin deficiency (11/23/2013), OSA (obstructive sleep apnea), OSA treated with BiPAP, and Sleep apnea with use of continuous positive airway pressure (CPAP). here with:  1. OSA on BiPAP 2. Memory disturbance  The patient's memory score has remained relatively stable.  I advised that we could send the patient for neuropsychological testing however he declined at this time.  The patient download shows good compliance however he does have a significant leak.  He will be sent for a mask refitting.  He is advised that if his symptoms worsen or he develops new symptoms he should let us know.  He will follow-up in 6 months or sooner if needed.     , MSN, NP-C 02/26/2017, 2:17 PM [facility] [address], [address] [place], [place] [address] [phone]   

## 2017-02-26 NOTE — Patient Instructions (Addendum)
Your Plan:  Continue using bipap nightly  Mask refitting Memory score is stable continue to monitor If your symptoms worsen or you develop new symptoms please let us know.     [facility]. I hope we have been able to provide you high quality care today.  You may receive a patient satisfaction survey over the next few weeks. We would appreciate your feedback and comments so that we may continue to improve ourselves and the health of our patients.

## 2017-02-26 NOTE — Progress Notes (Signed)
I agree with the assessment and plan as directed by NP .The patient is known to me .Offer MOCA or MMSE at least once yearly   [facility] Boyd Memorial HospitalDOHMEIER,Muadh Creasy, MD

## 2017-02-26 NOTE — Progress Notes (Signed)
DME order for mask refitting successfully faxed to [facility]. 

## 2017-03-03 NOTE — Progress Notes (Signed)
I agree with the assessment and plan as directed by NP .The patient is known to me and need regular cognitive testing, too.    Aneisha Skyles, MD

## 2017-03-06 DIAGNOSIS — G4733 Obstructive sleep apnea (adult) (pediatric): Secondary | ICD-10-CM | POA: Diagnosis not present

## 2017-03-17 DIAGNOSIS — H33011 Retinal detachment with single break, right eye: Secondary | ICD-10-CM | POA: Diagnosis not present

## 2017-03-17 DIAGNOSIS — E113593 Type 2 diabetes mellitus with proliferative diabetic retinopathy without macular edema, bilateral: Secondary | ICD-10-CM | POA: Diagnosis not present

## 2017-03-20 DIAGNOSIS — E109 Type 1 diabetes mellitus without complications: Secondary | ICD-10-CM | POA: Diagnosis not present

## 2017-03-23 DIAGNOSIS — M9903 Segmental and somatic dysfunction of lumbar region: Secondary | ICD-10-CM | POA: Diagnosis not present

## 2017-03-23 DIAGNOSIS — M9901 Segmental and somatic dysfunction of cervical region: Secondary | ICD-10-CM | POA: Diagnosis not present

## 2017-03-23 DIAGNOSIS — M5136 Other intervertebral disc degeneration, lumbar region: Secondary | ICD-10-CM | POA: Diagnosis not present

## 2017-03-23 DIAGNOSIS — M9904 Segmental and somatic dysfunction of sacral region: Secondary | ICD-10-CM | POA: Diagnosis not present

## 2017-03-31 DIAGNOSIS — E1022 Type 1 diabetes mellitus with diabetic chronic kidney disease: Secondary | ICD-10-CM | POA: Diagnosis not present

## 2017-03-31 DIAGNOSIS — E1021 Type 1 diabetes mellitus with diabetic nephropathy: Secondary | ICD-10-CM | POA: Diagnosis not present

## 2017-04-01 DIAGNOSIS — M9901 Segmental and somatic dysfunction of cervical region: Secondary | ICD-10-CM | POA: Diagnosis not present

## 2017-04-01 DIAGNOSIS — M5136 Other intervertebral disc degeneration, lumbar region: Secondary | ICD-10-CM | POA: Diagnosis not present

## 2017-04-01 DIAGNOSIS — M9904 Segmental and somatic dysfunction of sacral region: Secondary | ICD-10-CM | POA: Diagnosis not present

## 2017-04-01 DIAGNOSIS — M9903 Segmental and somatic dysfunction of lumbar region: Secondary | ICD-10-CM | POA: Diagnosis not present

## 2017-04-13 DIAGNOSIS — E669 Obesity, unspecified: Secondary | ICD-10-CM | POA: Diagnosis not present

## 2017-04-13 DIAGNOSIS — E211 Secondary hyperparathyroidism, not elsewhere classified: Secondary | ICD-10-CM | POA: Diagnosis not present

## 2017-04-13 DIAGNOSIS — E559 Vitamin D deficiency, unspecified: Secondary | ICD-10-CM | POA: Diagnosis not present

## 2017-04-13 DIAGNOSIS — I10 Essential (primary) hypertension: Secondary | ICD-10-CM | POA: Diagnosis not present

## 2017-04-13 DIAGNOSIS — N183 Chronic kidney disease, stage 3 (moderate): Secondary | ICD-10-CM | POA: Diagnosis not present

## 2017-04-13 DIAGNOSIS — N189 Chronic kidney disease, unspecified: Secondary | ICD-10-CM | POA: Diagnosis not present

## 2017-04-13 DIAGNOSIS — E119 Type 2 diabetes mellitus without complications: Secondary | ICD-10-CM | POA: Diagnosis not present

## 2017-04-13 DIAGNOSIS — N250 Renal osteodystrophy: Secondary | ICD-10-CM | POA: Diagnosis not present

## 2017-04-17 DIAGNOSIS — E1042 Type 1 diabetes mellitus with diabetic polyneuropathy: Secondary | ICD-10-CM | POA: Diagnosis not present

## 2017-04-17 DIAGNOSIS — Z794 Long term (current) use of insulin: Secondary | ICD-10-CM | POA: Diagnosis not present

## 2017-04-17 DIAGNOSIS — E109 Type 1 diabetes mellitus without complications: Secondary | ICD-10-CM | POA: Diagnosis not present

## 2017-04-17 DIAGNOSIS — E1022 Type 1 diabetes mellitus with diabetic chronic kidney disease: Secondary | ICD-10-CM | POA: Diagnosis not present

## 2017-04-17 DIAGNOSIS — N183 Chronic kidney disease, stage 3 (moderate): Secondary | ICD-10-CM | POA: Diagnosis not present

## 2017-05-16 DIAGNOSIS — W57XXXA Bitten or stung by nonvenomous insect and other nonvenomous arthropods, initial encounter: Secondary | ICD-10-CM | POA: Diagnosis not present

## 2017-05-16 DIAGNOSIS — T7840XA Allergy, unspecified, initial encounter: Secondary | ICD-10-CM | POA: Diagnosis not present

## 2017-05-16 DIAGNOSIS — E109 Type 1 diabetes mellitus without complications: Secondary | ICD-10-CM | POA: Diagnosis not present

## 2017-05-22 DIAGNOSIS — M9903 Segmental and somatic dysfunction of lumbar region: Secondary | ICD-10-CM | POA: Diagnosis not present

## 2017-05-22 DIAGNOSIS — I10 Essential (primary) hypertension: Secondary | ICD-10-CM | POA: Diagnosis not present

## 2017-05-22 DIAGNOSIS — S139XXA Sprain of joints and ligaments of unspecified parts of neck, initial encounter: Secondary | ICD-10-CM | POA: Diagnosis not present

## 2017-05-22 DIAGNOSIS — M5136 Other intervertebral disc degeneration, lumbar region: Secondary | ICD-10-CM | POA: Diagnosis not present

## 2017-05-22 DIAGNOSIS — M5030 Other cervical disc degeneration, unspecified cervical region: Secondary | ICD-10-CM | POA: Diagnosis not present

## 2017-05-22 DIAGNOSIS — M542 Cervicalgia: Secondary | ICD-10-CM | POA: Diagnosis not present

## 2017-05-22 DIAGNOSIS — M9901 Segmental and somatic dysfunction of cervical region: Secondary | ICD-10-CM | POA: Diagnosis not present

## 2017-05-22 DIAGNOSIS — E109 Type 1 diabetes mellitus without complications: Secondary | ICD-10-CM | POA: Diagnosis not present

## 2017-05-27 DIAGNOSIS — W57XXXD Bitten or stung by nonvenomous insect and other nonvenomous arthropods, subsequent encounter: Secondary | ICD-10-CM | POA: Diagnosis not present

## 2017-05-27 DIAGNOSIS — L089 Local infection of the skin and subcutaneous tissue, unspecified: Secondary | ICD-10-CM | POA: Diagnosis not present

## 2017-06-14 DIAGNOSIS — E109 Type 1 diabetes mellitus without complications: Secondary | ICD-10-CM | POA: Diagnosis not present

## 2017-06-17 DIAGNOSIS — M9903 Segmental and somatic dysfunction of lumbar region: Secondary | ICD-10-CM | POA: Diagnosis not present

## 2017-06-17 DIAGNOSIS — M5136 Other intervertebral disc degeneration, lumbar region: Secondary | ICD-10-CM | POA: Diagnosis not present

## 2017-06-17 DIAGNOSIS — M9901 Segmental and somatic dysfunction of cervical region: Secondary | ICD-10-CM | POA: Diagnosis not present

## 2017-06-17 DIAGNOSIS — M5030 Other cervical disc degeneration, unspecified cervical region: Secondary | ICD-10-CM | POA: Diagnosis not present

## 2017-06-19 DIAGNOSIS — E109 Type 1 diabetes mellitus without complications: Secondary | ICD-10-CM | POA: Diagnosis not present

## 2017-06-23 DIAGNOSIS — M5136 Other intervertebral disc degeneration, lumbar region: Secondary | ICD-10-CM | POA: Diagnosis not present

## 2017-06-23 DIAGNOSIS — M5030 Other cervical disc degeneration, unspecified cervical region: Secondary | ICD-10-CM | POA: Diagnosis not present

## 2017-06-23 DIAGNOSIS — M9903 Segmental and somatic dysfunction of lumbar region: Secondary | ICD-10-CM | POA: Diagnosis not present

## 2017-06-23 DIAGNOSIS — M9901 Segmental and somatic dysfunction of cervical region: Secondary | ICD-10-CM | POA: Diagnosis not present

## 2017-06-24 DIAGNOSIS — M71571 Other bursitis, not elsewhere classified, right ankle and foot: Secondary | ICD-10-CM | POA: Diagnosis not present

## 2017-06-24 DIAGNOSIS — L97519 Non-pressure chronic ulcer of other part of right foot with unspecified severity: Secondary | ICD-10-CM | POA: Diagnosis not present

## 2017-06-24 DIAGNOSIS — L97511 Non-pressure chronic ulcer of other part of right foot limited to breakdown of skin: Secondary | ICD-10-CM | POA: Diagnosis not present

## 2017-06-24 DIAGNOSIS — M19071 Primary osteoarthritis, right ankle and foot: Secondary | ICD-10-CM | POA: Diagnosis not present

## 2017-06-25 DIAGNOSIS — E1022 Type 1 diabetes mellitus with diabetic chronic kidney disease: Secondary | ICD-10-CM | POA: Diagnosis not present

## 2017-06-25 DIAGNOSIS — E1021 Type 1 diabetes mellitus with diabetic nephropathy: Secondary | ICD-10-CM | POA: Diagnosis not present

## 2017-06-29 DIAGNOSIS — L97511 Non-pressure chronic ulcer of other part of right foot limited to breakdown of skin: Secondary | ICD-10-CM | POA: Diagnosis not present

## 2017-06-29 DIAGNOSIS — M9903 Segmental and somatic dysfunction of lumbar region: Secondary | ICD-10-CM | POA: Diagnosis not present

## 2017-06-29 DIAGNOSIS — M9901 Segmental and somatic dysfunction of cervical region: Secondary | ICD-10-CM | POA: Diagnosis not present

## 2017-06-29 DIAGNOSIS — M5030 Other cervical disc degeneration, unspecified cervical region: Secondary | ICD-10-CM | POA: Diagnosis not present

## 2017-06-29 DIAGNOSIS — M5136 Other intervertebral disc degeneration, lumbar region: Secondary | ICD-10-CM | POA: Diagnosis not present

## 2017-06-30 DIAGNOSIS — M5136 Other intervertebral disc degeneration, lumbar region: Secondary | ICD-10-CM | POA: Diagnosis not present

## 2017-06-30 DIAGNOSIS — M5030 Other cervical disc degeneration, unspecified cervical region: Secondary | ICD-10-CM | POA: Diagnosis not present

## 2017-06-30 DIAGNOSIS — M9901 Segmental and somatic dysfunction of cervical region: Secondary | ICD-10-CM | POA: Diagnosis not present

## 2017-06-30 DIAGNOSIS — M9903 Segmental and somatic dysfunction of lumbar region: Secondary | ICD-10-CM | POA: Diagnosis not present

## 2017-07-16 DIAGNOSIS — E109 Type 1 diabetes mellitus without complications: Secondary | ICD-10-CM | POA: Diagnosis not present

## 2017-07-22 DIAGNOSIS — M5136 Other intervertebral disc degeneration, lumbar region: Secondary | ICD-10-CM | POA: Diagnosis not present

## 2017-07-22 DIAGNOSIS — M9901 Segmental and somatic dysfunction of cervical region: Secondary | ICD-10-CM | POA: Diagnosis not present

## 2017-07-22 DIAGNOSIS — M9903 Segmental and somatic dysfunction of lumbar region: Secondary | ICD-10-CM | POA: Diagnosis not present

## 2017-07-22 DIAGNOSIS — M5030 Other cervical disc degeneration, unspecified cervical region: Secondary | ICD-10-CM | POA: Diagnosis not present

## 2017-07-23 DIAGNOSIS — E1022 Type 1 diabetes mellitus with diabetic chronic kidney disease: Secondary | ICD-10-CM | POA: Diagnosis not present

## 2017-07-23 DIAGNOSIS — Z794 Long term (current) use of insulin: Secondary | ICD-10-CM | POA: Diagnosis not present

## 2017-07-23 DIAGNOSIS — E1042 Type 1 diabetes mellitus with diabetic polyneuropathy: Secondary | ICD-10-CM | POA: Diagnosis not present

## 2017-07-23 DIAGNOSIS — N183 Chronic kidney disease, stage 3 (moderate): Secondary | ICD-10-CM | POA: Diagnosis not present

## 2017-08-13 DIAGNOSIS — E109 Type 1 diabetes mellitus without complications: Secondary | ICD-10-CM | POA: Diagnosis not present

## 2017-08-27 DIAGNOSIS — D1801 Hemangioma of skin and subcutaneous tissue: Secondary | ICD-10-CM | POA: Diagnosis not present

## 2017-08-27 DIAGNOSIS — D692 Other nonthrombocytopenic purpura: Secondary | ICD-10-CM | POA: Diagnosis not present

## 2017-08-27 DIAGNOSIS — Z85828 Personal history of other malignant neoplasm of skin: Secondary | ICD-10-CM | POA: Diagnosis not present

## 2017-08-27 DIAGNOSIS — D0461 Carcinoma in situ of skin of right upper limb, including shoulder: Secondary | ICD-10-CM | POA: Diagnosis not present

## 2017-08-27 DIAGNOSIS — L821 Other seborrheic keratosis: Secondary | ICD-10-CM | POA: Diagnosis not present

## 2017-09-02 ENCOUNTER — Ambulatory Visit: Payer: Medicare Other | Admitting: Adult Health

## 2017-09-09 DIAGNOSIS — M5134 Other intervertebral disc degeneration, thoracic region: Secondary | ICD-10-CM | POA: Diagnosis not present

## 2017-09-09 DIAGNOSIS — M5136 Other intervertebral disc degeneration, lumbar region: Secondary | ICD-10-CM | POA: Diagnosis not present

## 2017-09-09 DIAGNOSIS — M9903 Segmental and somatic dysfunction of lumbar region: Secondary | ICD-10-CM | POA: Diagnosis not present

## 2017-09-09 DIAGNOSIS — M9902 Segmental and somatic dysfunction of thoracic region: Secondary | ICD-10-CM | POA: Diagnosis not present

## 2017-09-12 DIAGNOSIS — E109 Type 1 diabetes mellitus without complications: Secondary | ICD-10-CM | POA: Diagnosis not present

## 2017-09-15 DIAGNOSIS — H33011 Retinal detachment with single break, right eye: Secondary | ICD-10-CM | POA: Diagnosis not present

## 2017-09-15 DIAGNOSIS — E113593 Type 2 diabetes mellitus with proliferative diabetic retinopathy without macular edema, bilateral: Secondary | ICD-10-CM | POA: Diagnosis not present

## 2017-09-18 DIAGNOSIS — E1022 Type 1 diabetes mellitus with diabetic chronic kidney disease: Secondary | ICD-10-CM | POA: Diagnosis not present

## 2017-09-18 DIAGNOSIS — E1021 Type 1 diabetes mellitus with diabetic nephropathy: Secondary | ICD-10-CM | POA: Diagnosis not present

## 2017-09-21 DIAGNOSIS — M5134 Other intervertebral disc degeneration, thoracic region: Secondary | ICD-10-CM | POA: Diagnosis not present

## 2017-09-21 DIAGNOSIS — M9902 Segmental and somatic dysfunction of thoracic region: Secondary | ICD-10-CM | POA: Diagnosis not present

## 2017-09-21 DIAGNOSIS — E109 Type 1 diabetes mellitus without complications: Secondary | ICD-10-CM | POA: Diagnosis not present

## 2017-09-21 DIAGNOSIS — M5136 Other intervertebral disc degeneration, lumbar region: Secondary | ICD-10-CM | POA: Diagnosis not present

## 2017-09-21 DIAGNOSIS — M9903 Segmental and somatic dysfunction of lumbar region: Secondary | ICD-10-CM | POA: Diagnosis not present

## 2017-10-23 DIAGNOSIS — D520 Dietary folate deficiency anemia: Secondary | ICD-10-CM | POA: Diagnosis not present

## 2017-10-23 DIAGNOSIS — D649 Anemia, unspecified: Secondary | ICD-10-CM | POA: Diagnosis not present

## 2017-10-23 DIAGNOSIS — R809 Proteinuria, unspecified: Secondary | ICD-10-CM | POA: Diagnosis not present

## 2017-10-23 DIAGNOSIS — R309 Painful micturition, unspecified: Secondary | ICD-10-CM | POA: Diagnosis not present

## 2017-10-23 DIAGNOSIS — N189 Chronic kidney disease, unspecified: Secondary | ICD-10-CM | POA: Diagnosis not present

## 2017-10-23 DIAGNOSIS — E211 Secondary hyperparathyroidism, not elsewhere classified: Secondary | ICD-10-CM | POA: Diagnosis not present

## 2017-10-27 DIAGNOSIS — E109 Type 1 diabetes mellitus without complications: Secondary | ICD-10-CM | POA: Diagnosis not present

## 2017-11-03 DIAGNOSIS — N183 Chronic kidney disease, stage 3 (moderate): Secondary | ICD-10-CM | POA: Diagnosis not present

## 2017-11-03 DIAGNOSIS — E1042 Type 1 diabetes mellitus with diabetic polyneuropathy: Secondary | ICD-10-CM | POA: Diagnosis not present

## 2017-11-03 DIAGNOSIS — Z794 Long term (current) use of insulin: Secondary | ICD-10-CM | POA: Diagnosis not present

## 2017-11-03 DIAGNOSIS — E1022 Type 1 diabetes mellitus with diabetic chronic kidney disease: Secondary | ICD-10-CM | POA: Diagnosis not present

## 2017-11-17 DIAGNOSIS — E7849 Other hyperlipidemia: Secondary | ICD-10-CM | POA: Diagnosis not present

## 2017-11-17 DIAGNOSIS — I10 Essential (primary) hypertension: Secondary | ICD-10-CM | POA: Diagnosis not present

## 2017-11-17 DIAGNOSIS — E1029 Type 1 diabetes mellitus with other diabetic kidney complication: Secondary | ICD-10-CM | POA: Diagnosis not present

## 2017-11-17 DIAGNOSIS — R82998 Other abnormal findings in urine: Secondary | ICD-10-CM | POA: Diagnosis not present

## 2017-11-17 DIAGNOSIS — Z125 Encounter for screening for malignant neoplasm of prostate: Secondary | ICD-10-CM | POA: Diagnosis not present

## 2017-11-23 DIAGNOSIS — M9902 Segmental and somatic dysfunction of thoracic region: Secondary | ICD-10-CM | POA: Diagnosis not present

## 2017-11-23 DIAGNOSIS — M5134 Other intervertebral disc degeneration, thoracic region: Secondary | ICD-10-CM | POA: Diagnosis not present

## 2017-11-23 DIAGNOSIS — M5030 Other cervical disc degeneration, unspecified cervical region: Secondary | ICD-10-CM | POA: Diagnosis not present

## 2017-11-23 DIAGNOSIS — M9901 Segmental and somatic dysfunction of cervical region: Secondary | ICD-10-CM | POA: Diagnosis not present

## 2017-11-24 DIAGNOSIS — I10 Essential (primary) hypertension: Secondary | ICD-10-CM | POA: Diagnosis not present

## 2017-11-24 DIAGNOSIS — Z23 Encounter for immunization: Secondary | ICD-10-CM | POA: Diagnosis not present

## 2017-11-24 DIAGNOSIS — E1039 Type 1 diabetes mellitus with other diabetic ophthalmic complication: Secondary | ICD-10-CM | POA: Diagnosis not present

## 2017-11-24 DIAGNOSIS — Z0000 Encounter for general adult medical examination without abnormal findings: Secondary | ICD-10-CM | POA: Diagnosis not present

## 2017-11-24 DIAGNOSIS — E1029 Type 1 diabetes mellitus with other diabetic kidney complication: Secondary | ICD-10-CM | POA: Diagnosis not present

## 2017-11-28 DIAGNOSIS — E109 Type 1 diabetes mellitus without complications: Secondary | ICD-10-CM | POA: Diagnosis not present

## 2017-12-01 ENCOUNTER — Ambulatory Visit: Payer: Medicare Other | Admitting: Neurology

## 2017-12-18 DIAGNOSIS — E109 Type 1 diabetes mellitus without complications: Secondary | ICD-10-CM | POA: Diagnosis not present

## 2017-12-27 DIAGNOSIS — E1022 Type 1 diabetes mellitus with diabetic chronic kidney disease: Secondary | ICD-10-CM | POA: Diagnosis not present

## 2017-12-27 DIAGNOSIS — E1021 Type 1 diabetes mellitus with diabetic nephropathy: Secondary | ICD-10-CM | POA: Diagnosis not present

## 2018-01-01 DIAGNOSIS — M5030 Other cervical disc degeneration, unspecified cervical region: Secondary | ICD-10-CM | POA: Diagnosis not present

## 2018-01-01 DIAGNOSIS — M9901 Segmental and somatic dysfunction of cervical region: Secondary | ICD-10-CM | POA: Diagnosis not present

## 2018-01-01 DIAGNOSIS — M5136 Other intervertebral disc degeneration, lumbar region: Secondary | ICD-10-CM | POA: Diagnosis not present

## 2018-01-01 DIAGNOSIS — E109 Type 1 diabetes mellitus without complications: Secondary | ICD-10-CM | POA: Diagnosis not present

## 2018-01-01 DIAGNOSIS — M9903 Segmental and somatic dysfunction of lumbar region: Secondary | ICD-10-CM | POA: Diagnosis not present

## 2018-01-25 DIAGNOSIS — M5136 Other intervertebral disc degeneration, lumbar region: Secondary | ICD-10-CM | POA: Diagnosis not present

## 2018-01-25 DIAGNOSIS — M5030 Other cervical disc degeneration, unspecified cervical region: Secondary | ICD-10-CM | POA: Diagnosis not present

## 2018-01-25 DIAGNOSIS — M9903 Segmental and somatic dysfunction of lumbar region: Secondary | ICD-10-CM | POA: Diagnosis not present

## 2018-01-25 DIAGNOSIS — M9901 Segmental and somatic dysfunction of cervical region: Secondary | ICD-10-CM | POA: Diagnosis not present

## 2018-02-01 DIAGNOSIS — M5136 Other intervertebral disc degeneration, lumbar region: Secondary | ICD-10-CM | POA: Diagnosis not present

## 2018-02-01 DIAGNOSIS — M9901 Segmental and somatic dysfunction of cervical region: Secondary | ICD-10-CM | POA: Diagnosis not present

## 2018-02-01 DIAGNOSIS — M9903 Segmental and somatic dysfunction of lumbar region: Secondary | ICD-10-CM | POA: Diagnosis not present

## 2018-02-01 DIAGNOSIS — M9904 Segmental and somatic dysfunction of sacral region: Secondary | ICD-10-CM | POA: Diagnosis not present

## 2018-02-08 DIAGNOSIS — M9901 Segmental and somatic dysfunction of cervical region: Secondary | ICD-10-CM | POA: Diagnosis not present

## 2018-02-08 DIAGNOSIS — M9903 Segmental and somatic dysfunction of lumbar region: Secondary | ICD-10-CM | POA: Diagnosis not present

## 2018-02-08 DIAGNOSIS — M9904 Segmental and somatic dysfunction of sacral region: Secondary | ICD-10-CM | POA: Diagnosis not present

## 2018-02-08 DIAGNOSIS — E109 Type 1 diabetes mellitus without complications: Secondary | ICD-10-CM | POA: Diagnosis not present

## 2018-02-08 DIAGNOSIS — M5136 Other intervertebral disc degeneration, lumbar region: Secondary | ICD-10-CM | POA: Diagnosis not present

## 2018-02-15 DIAGNOSIS — Z794 Long term (current) use of insulin: Secondary | ICD-10-CM | POA: Diagnosis not present

## 2018-02-15 DIAGNOSIS — E1022 Type 1 diabetes mellitus with diabetic chronic kidney disease: Secondary | ICD-10-CM | POA: Diagnosis not present

## 2018-02-15 DIAGNOSIS — E1042 Type 1 diabetes mellitus with diabetic polyneuropathy: Secondary | ICD-10-CM | POA: Diagnosis not present

## 2018-02-15 DIAGNOSIS — N183 Chronic kidney disease, stage 3 (moderate): Secondary | ICD-10-CM | POA: Diagnosis not present

## 2018-02-16 DIAGNOSIS — Z1211 Encounter for screening for malignant neoplasm of colon: Secondary | ICD-10-CM | POA: Diagnosis not present

## 2018-02-22 DIAGNOSIS — M9903 Segmental and somatic dysfunction of lumbar region: Secondary | ICD-10-CM | POA: Diagnosis not present

## 2018-02-22 DIAGNOSIS — M9901 Segmental and somatic dysfunction of cervical region: Secondary | ICD-10-CM | POA: Diagnosis not present

## 2018-02-22 DIAGNOSIS — M5136 Other intervertebral disc degeneration, lumbar region: Secondary | ICD-10-CM | POA: Diagnosis not present

## 2018-02-22 DIAGNOSIS — M9904 Segmental and somatic dysfunction of sacral region: Secondary | ICD-10-CM | POA: Diagnosis not present

## 2018-03-01 DIAGNOSIS — D225 Melanocytic nevi of trunk: Secondary | ICD-10-CM | POA: Diagnosis not present

## 2018-03-01 DIAGNOSIS — M9903 Segmental and somatic dysfunction of lumbar region: Secondary | ICD-10-CM | POA: Diagnosis not present

## 2018-03-01 DIAGNOSIS — L821 Other seborrheic keratosis: Secondary | ICD-10-CM | POA: Diagnosis not present

## 2018-03-01 DIAGNOSIS — M5136 Other intervertebral disc degeneration, lumbar region: Secondary | ICD-10-CM | POA: Diagnosis not present

## 2018-03-01 DIAGNOSIS — Z85828 Personal history of other malignant neoplasm of skin: Secondary | ICD-10-CM | POA: Diagnosis not present

## 2018-03-01 DIAGNOSIS — L570 Actinic keratosis: Secondary | ICD-10-CM | POA: Diagnosis not present

## 2018-03-01 DIAGNOSIS — L814 Other melanin hyperpigmentation: Secondary | ICD-10-CM | POA: Diagnosis not present

## 2018-03-01 DIAGNOSIS — M9904 Segmental and somatic dysfunction of sacral region: Secondary | ICD-10-CM | POA: Diagnosis not present

## 2018-03-01 DIAGNOSIS — M9901 Segmental and somatic dysfunction of cervical region: Secondary | ICD-10-CM | POA: Diagnosis not present

## 2018-03-14 DIAGNOSIS — E109 Type 1 diabetes mellitus without complications: Secondary | ICD-10-CM | POA: Diagnosis not present

## 2018-03-18 DIAGNOSIS — E109 Type 1 diabetes mellitus without complications: Secondary | ICD-10-CM | POA: Diagnosis not present

## 2018-03-19 DIAGNOSIS — M5136 Other intervertebral disc degeneration, lumbar region: Secondary | ICD-10-CM | POA: Diagnosis not present

## 2018-03-19 DIAGNOSIS — M9903 Segmental and somatic dysfunction of lumbar region: Secondary | ICD-10-CM | POA: Diagnosis not present

## 2018-03-19 DIAGNOSIS — M5030 Other cervical disc degeneration, unspecified cervical region: Secondary | ICD-10-CM | POA: Diagnosis not present

## 2018-03-19 DIAGNOSIS — M9901 Segmental and somatic dysfunction of cervical region: Secondary | ICD-10-CM | POA: Diagnosis not present

## 2018-03-22 DIAGNOSIS — M5030 Other cervical disc degeneration, unspecified cervical region: Secondary | ICD-10-CM | POA: Diagnosis not present

## 2018-03-22 DIAGNOSIS — M5136 Other intervertebral disc degeneration, lumbar region: Secondary | ICD-10-CM | POA: Diagnosis not present

## 2018-03-22 DIAGNOSIS — M9903 Segmental and somatic dysfunction of lumbar region: Secondary | ICD-10-CM | POA: Diagnosis not present

## 2018-03-22 DIAGNOSIS — M9901 Segmental and somatic dysfunction of cervical region: Secondary | ICD-10-CM | POA: Diagnosis not present

## 2018-03-29 DIAGNOSIS — M9901 Segmental and somatic dysfunction of cervical region: Secondary | ICD-10-CM | POA: Diagnosis not present

## 2018-03-29 DIAGNOSIS — M9903 Segmental and somatic dysfunction of lumbar region: Secondary | ICD-10-CM | POA: Diagnosis not present

## 2018-03-29 DIAGNOSIS — M5030 Other cervical disc degeneration, unspecified cervical region: Secondary | ICD-10-CM | POA: Diagnosis not present

## 2018-03-29 DIAGNOSIS — M5136 Other intervertebral disc degeneration, lumbar region: Secondary | ICD-10-CM | POA: Diagnosis not present

## 2018-03-30 ENCOUNTER — Ambulatory Visit: Payer: Medicare Other | Admitting: Neurology

## 2018-04-05 DIAGNOSIS — M5136 Other intervertebral disc degeneration, lumbar region: Secondary | ICD-10-CM | POA: Diagnosis not present

## 2018-04-05 DIAGNOSIS — M5030 Other cervical disc degeneration, unspecified cervical region: Secondary | ICD-10-CM | POA: Diagnosis not present

## 2018-04-05 DIAGNOSIS — M9901 Segmental and somatic dysfunction of cervical region: Secondary | ICD-10-CM | POA: Diagnosis not present

## 2018-04-05 DIAGNOSIS — M9903 Segmental and somatic dysfunction of lumbar region: Secondary | ICD-10-CM | POA: Diagnosis not present

## 2018-04-08 DIAGNOSIS — E109 Type 1 diabetes mellitus without complications: Secondary | ICD-10-CM | POA: Diagnosis not present

## 2018-04-19 DIAGNOSIS — M5030 Other cervical disc degeneration, unspecified cervical region: Secondary | ICD-10-CM | POA: Diagnosis not present

## 2018-04-19 DIAGNOSIS — M5136 Other intervertebral disc degeneration, lumbar region: Secondary | ICD-10-CM | POA: Diagnosis not present

## 2018-04-19 DIAGNOSIS — M9901 Segmental and somatic dysfunction of cervical region: Secondary | ICD-10-CM | POA: Diagnosis not present

## 2018-04-19 DIAGNOSIS — M9903 Segmental and somatic dysfunction of lumbar region: Secondary | ICD-10-CM | POA: Diagnosis not present

## 2018-04-22 DIAGNOSIS — E1021 Type 1 diabetes mellitus with diabetic nephropathy: Secondary | ICD-10-CM | POA: Diagnosis not present

## 2018-04-22 DIAGNOSIS — E1022 Type 1 diabetes mellitus with diabetic chronic kidney disease: Secondary | ICD-10-CM | POA: Diagnosis not present

## 2018-04-28 DIAGNOSIS — M5030 Other cervical disc degeneration, unspecified cervical region: Secondary | ICD-10-CM | POA: Diagnosis not present

## 2018-04-28 DIAGNOSIS — M5136 Other intervertebral disc degeneration, lumbar region: Secondary | ICD-10-CM | POA: Diagnosis not present

## 2018-04-28 DIAGNOSIS — M9901 Segmental and somatic dysfunction of cervical region: Secondary | ICD-10-CM | POA: Diagnosis not present

## 2018-04-28 DIAGNOSIS — M9903 Segmental and somatic dysfunction of lumbar region: Secondary | ICD-10-CM | POA: Diagnosis not present

## 2018-05-08 DIAGNOSIS — E109 Type 1 diabetes mellitus without complications: Secondary | ICD-10-CM | POA: Diagnosis not present

## 2018-05-12 DIAGNOSIS — M9901 Segmental and somatic dysfunction of cervical region: Secondary | ICD-10-CM | POA: Diagnosis not present

## 2018-05-12 DIAGNOSIS — M9903 Segmental and somatic dysfunction of lumbar region: Secondary | ICD-10-CM | POA: Diagnosis not present

## 2018-05-12 DIAGNOSIS — M5030 Other cervical disc degeneration, unspecified cervical region: Secondary | ICD-10-CM | POA: Diagnosis not present

## 2018-05-12 DIAGNOSIS — M5136 Other intervertebral disc degeneration, lumbar region: Secondary | ICD-10-CM | POA: Diagnosis not present

## 2018-05-19 ENCOUNTER — Encounter: Payer: Self-pay | Admitting: Adult Health

## 2018-05-26 DIAGNOSIS — M5136 Other intervertebral disc degeneration, lumbar region: Secondary | ICD-10-CM | POA: Diagnosis not present

## 2018-05-26 DIAGNOSIS — M9901 Segmental and somatic dysfunction of cervical region: Secondary | ICD-10-CM | POA: Diagnosis not present

## 2018-05-26 DIAGNOSIS — M5030 Other cervical disc degeneration, unspecified cervical region: Secondary | ICD-10-CM | POA: Diagnosis not present

## 2018-05-26 DIAGNOSIS — M9903 Segmental and somatic dysfunction of lumbar region: Secondary | ICD-10-CM | POA: Diagnosis not present

## 2018-06-07 DIAGNOSIS — M9903 Segmental and somatic dysfunction of lumbar region: Secondary | ICD-10-CM | POA: Diagnosis not present

## 2018-06-07 DIAGNOSIS — M5136 Other intervertebral disc degeneration, lumbar region: Secondary | ICD-10-CM | POA: Diagnosis not present

## 2018-06-07 DIAGNOSIS — M9901 Segmental and somatic dysfunction of cervical region: Secondary | ICD-10-CM | POA: Diagnosis not present

## 2018-06-07 DIAGNOSIS — M5030 Other cervical disc degeneration, unspecified cervical region: Secondary | ICD-10-CM | POA: Diagnosis not present

## 2018-06-13 DIAGNOSIS — E109 Type 1 diabetes mellitus without complications: Secondary | ICD-10-CM | POA: Diagnosis not present

## 2018-06-21 DIAGNOSIS — M9903 Segmental and somatic dysfunction of lumbar region: Secondary | ICD-10-CM | POA: Diagnosis not present

## 2018-06-21 DIAGNOSIS — M9901 Segmental and somatic dysfunction of cervical region: Secondary | ICD-10-CM | POA: Diagnosis not present

## 2018-06-21 DIAGNOSIS — M5136 Other intervertebral disc degeneration, lumbar region: Secondary | ICD-10-CM | POA: Diagnosis not present

## 2018-06-21 DIAGNOSIS — M5030 Other cervical disc degeneration, unspecified cervical region: Secondary | ICD-10-CM | POA: Diagnosis not present

## 2018-06-23 DIAGNOSIS — E109 Type 1 diabetes mellitus without complications: Secondary | ICD-10-CM | POA: Diagnosis not present

## 2018-07-05 DIAGNOSIS — M9903 Segmental and somatic dysfunction of lumbar region: Secondary | ICD-10-CM | POA: Diagnosis not present

## 2018-07-05 DIAGNOSIS — M9901 Segmental and somatic dysfunction of cervical region: Secondary | ICD-10-CM | POA: Diagnosis not present

## 2018-07-05 DIAGNOSIS — M5030 Other cervical disc degeneration, unspecified cervical region: Secondary | ICD-10-CM | POA: Diagnosis not present

## 2018-07-05 DIAGNOSIS — M5136 Other intervertebral disc degeneration, lumbar region: Secondary | ICD-10-CM | POA: Diagnosis not present

## 2018-07-12 DIAGNOSIS — E109 Type 1 diabetes mellitus without complications: Secondary | ICD-10-CM | POA: Diagnosis not present

## 2018-07-14 ENCOUNTER — Other Ambulatory Visit: Payer: Self-pay

## 2018-07-14 ENCOUNTER — Encounter: Payer: Self-pay | Admitting: Neurology

## 2018-07-14 ENCOUNTER — Ambulatory Visit: Payer: Medicare Other | Admitting: Neurology

## 2018-07-14 VITALS — BP 153/80 | HR 76 | Temp 97.8°F | Ht 75.0 in | Wt 246.0 lb

## 2018-07-14 DIAGNOSIS — G47419 Narcolepsy without cataplexy: Secondary | ICD-10-CM

## 2018-07-14 DIAGNOSIS — G4731 Primary central sleep apnea: Secondary | ICD-10-CM

## 2018-07-14 DIAGNOSIS — G4730 Sleep apnea, unspecified: Secondary | ICD-10-CM

## 2018-07-14 DIAGNOSIS — G3184 Mild cognitive impairment, so stated: Secondary | ICD-10-CM | POA: Diagnosis not present

## 2018-07-14 DIAGNOSIS — R4181 Age-related cognitive decline: Secondary | ICD-10-CM

## 2018-07-14 NOTE — Patient Instructions (Signed)

## 2018-07-14 NOTE — Progress Notes (Signed)
[facility]  Provider:  Larey Seat, M D  Referring Provider: Marton Redwood, MD Primary Care Physician:  Marton Redwood, MD  Chief Complaint  Patient presents with  . Follow-up    pt alone, rm 11. pt states following up today. he states there is no concerns. DME [facility], machine was set up in 2013.    [name] is a 68 y.o. [place] born, caucasian, married male - he has been my patient since 2007. He has complex sleep apnea with hypersomnia. He is  insulin dependent DM,and continues to work as a Curator, Land, and Teaching laboratory technician. He does this more as a hobby. He is a CPAP user, his machine is from 2013 an needs replaced. He is worried about progressive memory loss.  The compliance for the current VPAP adapt S9 has been excellent 100% as it has been in the years before.  [name] uses the ASV with an inspiratory pressure system of 10 cmH2O minimum pressure support of 4 maximum pressure support of 10 cm and has a residual AHI of 6.4.  He does have a lot of air leakage and he states that the headgear is not necessarily tight enough.Prefers to sleep in supine position which would not dislodge the mask but may dislodge the headgear. He likes a nasal pillow- I will ask him to try a bella swift with the new machine.   He is up for a new VPAP machine , it current one just shuts off for no reason.    [name]        08-26-2016 ,  He is seen here as a  revisit  from Dr. Lutricia Feil, his internist, for Sleep apnea and BiPAP- therapy follow up. [name] is a highly compliant user of ASV, expiratory pressure 10 cm water, minimum pressure support 4, maximum pressure support 10 cm water he has a residual hypotony index of 7.7 which is higher than in the past 12 months. However he still has much better resolution of apnea in spite of high air leaks than he had untreated. I do think that air leaks cause some of his respiratory events. His minute ventilation,  respiratory rate and tidal volume have not varied greatly. Since air leaks seem to be the main problem here, I do not need to adjust his settings. The patient's Epworth sleepiness score today was endorsed at 9 points, fatigue severity at 43 and geriatric depression score 3 out of 15 points. This all is better than anytime else in the last 18 months.  He still reports memory decline-  And he is not accepting the diagnosis of narcolepsy. Next visit in 6 month with MMSE or MOCA.  He had the following questions; [name] is concerned that his water reservoir's bottom seems to be deteriorating, may be rusting. The patient is followed by advanced home care. I for him to make an appointment and show his machine to the respiratory tech.    02-2016 CD He has plans to visit the country of his birth and wants to make sure that BiPAP can be used on flight and at destination. He has a long history of DM type 1 .  Established patient - since 2012, right-handed married  gentleman, with a history of complex apnea.  Had headaches before treatment with BiPAP, his HbA1c have been in the low 6 range. Memory has declined. His high fatigue and Epworth scores remained high after BiPAP. He was tested for narcolepsy by HLA: positive !  He just left for a journey to [place] when we got these result, and we meet now after his return. He endorses today Epworth at 9, feels rested and restored. He feels that he can drive and is alert for hours, no sleep attacks.  Does he need medications ? No, or not yet. Explained the genetic and autoimmune barrier to narcolepsy.   Last visit note CD.   He had sleep complains of persistent excessive daytime sleepiness despite nightly use of CPAP associated with restless legs and snoring. The patient also has a long-standing history of type 1 diabetes. A split-night study was performed on 01-22-11 showed evidence of CPAP and was central apneas and the patient was asked to return to use a BiPAP with  backup rate or an adaptive serval ventilation device. At the time in beverages doesn't 13 the patient endorsed the Epworth sleepiness score at 17/23 points and the backs inventory at 16 points his BMI was 30.2 his neck circumference 17.5 inches. The patient responded best to adapt SV is an EEP- Expiratory pressure of 10 cm,  and pressure support of 4 cm minimum and 15 cm maximum. He did have problems is continued periodic limb movements at over 60 per hour and associated arousals at about 22 per hour. Sept 2014 download from his recent machine but showed residual AHI of 2.6,  thereby complete resolution of his apnea, he is using a minimum pressure support of 4 cm water maximum pressure support of 10 cm water and EEP at 10 cm.  Epworth 15 points, FSS 48,  GDS 2 .   Interval history for 08/29/2015, [name] is here today for his ASV compliance visit. He has been using his machine and automatic servo ventilation with an expiratory pressure support of 10 cm water and a minimum pressure support of 4 cm . Maximum of 10 cm water pressure and a residual AHI of 7.3,but he does have high air leaks. The residual AHI consists of hypopneas.   His compliance is 100% with  8 hours and 23 minutes on average nightly use of nasal pillow interface.[name] His Epworth sleepiness score is endorsed at 12 points , his geriatric depression score is endorsed at 2 out of 15 points.  He reports having word finding difficulties and at times what he calls " garbled speech" . He would be placed words with a sequence of sounds that makes sense to him but not to the person listening to him, it is not a paraphasic error and it is not using a word that sounds alike. He noted delayed word finding and " pulling a blank ", has not gotten lost while driving. He feels at times a lack of focus.  I added today a Montral cognitive assessment the patient did excellent on visual spatial and executive function, recalled 3 of 5 words, and was  able to generate 17 words. 27/30 points.  02-27-2016  [name] reports that he retreats for a nap when his level of fatigue becomes bothersome to him, he can work at his own pace,  He endorsed the geriatric depression score at 6 points, voicing that this merely reflect frustration. He does not appear depressed he endorses again a high degree of fatigue at 54 points, and a high degree of excessive daytime sleepiness at 16 points, which I would expect given that he was age L a positive for narcolepsy. Today's Montral cognitive assessment 22-30 , a  lower point count than in the past- he reports  he is sleepy , always in the early afternoon.  Compliance with CPAP is 100% the patient uses an a SV expiratory pressure is 10 cm water minimum pressure support for maximum pressure support 10 cm , residual AHI is 6.9 mainly determined by high air leaks. He does feel comfortable using CPAP take a nap without it. No reason to change any of the settings as he feels comfortable even with the airleak which undoubtedly causes some of these hypopneas to resurge.    Montreal Cognitive Assessment  07/14/2018 02/26/2017 02/27/2016 08/29/2015  Visuospatial/ Executive (0/5) _0 Naming (0/3) _1 Attention: Read list of digits (0/2) _2 Attention: Read list of letters (0/1) _3 Attention: Serial 7 subtraction starting at 100 (0/3) _4 Language: Repeat phrase (0/2) _5 Language : Fluency (0/1) _6 Abstraction (0/2) _7 Delayed Recall (0/5) 3 0 2 3  Orientation (0/6) _8 Total _9 Adjusted Score (based on education) - - 22 27    Montreal Cognitive Assessment  07/14/2018 02/26/2017 02/27/2016 08/29/2015  Visuospatial/ Executive (0/5) _10 Naming (0/3) _11 Attention: Read list of digits (0/2) _12 Attention: Read list of letters (0/1) _13 Attention: Serial 7 subtraction starting at 100 (0/3) _14 Language: Repeat phrase (0/2) _15 Language :  Fluency (0/1) _16 Abstraction (0/2) _17 Delayed Recall (0/5) 3 0 2 3  Orientation (0/6) _18 Total _19 Adjusted Score (based on education) - - 22 27     Social History   Socioeconomic History  . Marital status: Married    Spouse name: [name]  . Number of children: 1  . Years of education: 55  . 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 Financial resource strain: Not on file  . Food insecurity    Worry: Not on file    Inability: Not on file  . Transportation needs    Medical: Not on file    Non-medical: Not on file  Tobacco Use  . Smoking status: Never Smoker  . Smokeless tobacco: Never Used  Substance and Sexual Activity  . Alcohol use: No  . Drug use: No  . Sexual activity: Not on file  Lifestyle  . Physical activity    Days per week: Not on file    Minutes per session: Not on file  . Stress: Not on file  Relationships  . Social Herbalist on phone: Not on file    Gets together: Not on file    Attends religious service: Not on file    Active member of club or organization: Not on file    Attends meetings of clubs or organizations: Not on file    Relationship status: Not on file  . Intimate partner violence    Fear of current or ex partner: Not on file    Emotionally abused: Not on file    Physically abused: Not on file    Forced sexual activity: Not on file  Other Topics Concern  . Not on file  Social History Narrative   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   Patient has one adult child.   Patient is working part-time and is retired.   Patient has a Haematologist.   Patient is right-handed.   Patient drinks four cups of coffee daily.    Family History  Problem Relation Age of Onset  . Arthritis Sister   . Sleep apnea Brother   . Diabetes Other   . Heart failure Mother   . Diabetes Maternal Uncle     Past Medical History:   Diagnosis Date  . Anxiety   . Arthritis   . Diabetes mellitus without complication ([place])   . Head injury 09/16/12   12 stitches  . Hypertension   . Migraine   . 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 OSA (obstructive sleep apnea)   . OSA treated with BiPAP   . Sleep apnea with use of continuous positive airway pressure (CPAP)    sleep study revealed centrals, adapt SV EPAP at 10 cm water 02-19-11     Past Surgical History:  Procedure Laterality Date  . COLON SURGERY  2010   collapsed colon  . TRIGGER FINGER RELEASE     x4    Current Outpatient Medications  Medication Sig Dispense Refill  . amLODipine (NORVASC) 5 MG tablet Take 5 mg by mouth daily.    . butalbital-aspirin-caffeine (FIORINAL) 50-325-40 MG capsule TAKE 1 CAPSULE BY MOUTH EVERY 4 HOURS AS NEEDED FOR SEVERE MIGRAINE    . CINNAMON PO Take 2 tablets by mouth daily.    . citalopram (CELEXA) 20 MG tablet Take 20 mg by mouth daily.    . cloNIDine (CATAPRES) 0.1 MG tablet Take 0.1 mg by mouth daily as needed (for high BP).    [name] diclofenac sodium (VOLTAREN) 1 % GEL APPLY 2 GRAMS EVERY 6 HOURS AS NEEDED FOR SHOULDER PAIN    . fluocinonide cream (LIDEX) 0.05 %   2  . HUMALOG 100 UNIT/ML injection USE 90 UNITS DAY VIA INSULIN PUMP SUBCUTANEOUS    . hydrochlorothiazide (MICROZIDE) 12.5 MG capsule Take 25 mg by mouth daily.     [name] ibuprofen (ADVIL,MOTRIN) 200 MG tablet Take 200-400 mg by mouth every 6 (six) hours as needed for pain.    . Insulin Human (INSULIN PUMP) 100 unit/ml SOLN Inject 70 each into the skin continuous. Humalog insulin    . lisinopril (ZESTRIL) 10 MG tablet Take 10 mg by mouth daily.    [name] LISINOPRIL PO Take by mouth.    . Multiple Vitamin (MULTIVITAMIN WITH MINERALS) TABS tablet Take 1 tablet by mouth daily.    [name] omeprazole (PRILOSEC) 40 MG capsule Take by mouth.    Vladimir Faster Glycol-Propyl Glycol (SYSTANE) 0.4-0.3 % SOLN Apply 2 drops  to eye 3 (three) times daily.    . vitamin C (ASCORBIC ACID) 500 MG tablet Take 500 mg by mouth daily.     No current facility-administered medications for this visit.     Allergies as of 07/14/2018 - Review Complete 07/14/2018  Allergen Reaction Noted  . Codeine Nausea And Vomiting 10/27/2011  . Dilaudid [hydromorphone hcl] Anaphylaxis 10/27/2011  . Gluten meal Other (See Comments) 09/16/2012  . Morphine and related Anaphylaxis 10/27/2011  . Toradol [ketorolac tromethamine] Nausea And Vomiting 10/27/2011  . Penicillins Other (See Comments) 10/27/2011    Vitals: BP (!) 153/80   Pulse 76   Temp  97.8 F (36.6 C)   Ht 6' 3" (1.905 m)   Wt 246 lb (111.6 kg)   BMI 30.75 kg/m  Last Weight:  Wt Readings from Last 1 Encounters:  07/14/18 246 lb (111.6 kg)   Last Height:   Ht Readings from Last 1 Encounters:  07/14/18 6' 3" (1.905 m)    Physical exam:  General: The patient is awake, alert and appears not in acute distress. The patient is well groomed. Head: Normocephalic, No fall, no faint, no LOC.  Neck is supple. Mallampati 3 , neck circumference: 15. 5"  Neurologic exam :The patient is awake and alert, oriented to place and time.   Memory subjective described as impaired , short term memory primarily- he forgets apointments and parts of conversation, now struggles for words. He feels today is a  Good day, reflected in today's MOCA result.   Montreal Cognitive Assessment  07/14/2018 02/26/2017 02/27/2016 08/29/2015  Visuospatial/ Executive (0/5) _0 Naming (0/3) _1 Attention: Read list of digits (0/2) _2 Attention: Read list of letters (0/1) _3 Attention: Serial 7 subtraction starting at 100 (0/3) _4 Language: Repeat phrase (0/2) _5 Language : Fluency (0/1) _6 Abstraction (0/2) _7 Delayed Recall (0/5) 3 0 2 3  Orientation (0/6) _8 Total _9 Adjusted Score (based on education) - - 22 27    Cranial nerves:Pupils  are equally slowly reactive to light. This is long standing.   He underwent a laser surgery for retinopathy and an injury .  His vision has improved for acuity, no defiiciency in color perception.Extraocular movements  in vertical and horizontal planes intact and without nystagmus. Visual fields by finger perimetry are intact. Hearing to finger rub intact.  Facial motor strength is symmetric and tongue and uvula move midline. Motor exam:   Normal tone and normal muscle bulk and symmetric, attenuated -  He has shoulder movement limitations.  ROM in elbow, wrist and fingers are normal. Normal strength in all extremities. 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  Unable to feel vibration and filament touch below the ankles bilaterally, skin is shiny , dystrophic over both feet.  Coordination: Rapid alternating movements in the fingers/hands is normal.  Finger-to-nose maneuver : noted dysmetria , not  tremor. Gait and station: Patient walks without assistive device .  Deep tendon reflexes: in the upper and lower extremities are attenauted, symmetric and intact. Babinski maneuver response is equivocal .   Assessment:  1) patient with complex apnea and HLA positive for narcolepsy - he is still in doubt about the narcolepsy .  Manages with scheduled naps. Works part time. Takes daytime naps.   2)  he feels a significant impairment in comparison to his younger self- doesn't like peer to peer comparison . MOCA was 22/30 - concerning for early demntia, and he scored today 25/ 55- which is only mild cognitive impairment.  He is longtime insulin dependent - over 35 years . diabetic and at high risk for cerebral  microvascular disease.   3) diabetic neuropathy in adult onset DM 1. Minimal retinopathy, but peripheral neuropathy.  Restless legs have developed.    Plan:  Treatment plan and additional workup : Complex sleep apnea- and excessive  daytime sleepiness. patient uses ASV,  and has positive HLA test.  continue ASV-PAP use with bella swift ear loops form now on, new VPAP ordered. stimulant medication offered, he struggles with HTN and would still like to hold off. He doesn't drive when tired, and not long distance.  MOCA , every 6 month.  Next visit with NP . ASV follow up in 3 month with me and afterwards yearly alternating with Np and with MOCA  Consider MRI brain q 3 years - he has an insulin pump Larey Seat, MD

## 2018-07-19 DIAGNOSIS — M5136 Other intervertebral disc degeneration, lumbar region: Secondary | ICD-10-CM | POA: Diagnosis not present

## 2018-07-19 DIAGNOSIS — M9901 Segmental and somatic dysfunction of cervical region: Secondary | ICD-10-CM | POA: Diagnosis not present

## 2018-07-19 DIAGNOSIS — M9903 Segmental and somatic dysfunction of lumbar region: Secondary | ICD-10-CM | POA: Diagnosis not present

## 2018-07-19 DIAGNOSIS — M5030 Other cervical disc degeneration, unspecified cervical region: Secondary | ICD-10-CM | POA: Diagnosis not present

## 2018-07-22 DIAGNOSIS — E113553 Type 2 diabetes mellitus with stable proliferative diabetic retinopathy, bilateral: Secondary | ICD-10-CM | POA: Diagnosis not present

## 2018-07-22 DIAGNOSIS — E1021 Type 1 diabetes mellitus with diabetic nephropathy: Secondary | ICD-10-CM | POA: Diagnosis not present

## 2018-07-22 DIAGNOSIS — H35031 Hypertensive retinopathy, right eye: Secondary | ICD-10-CM | POA: Diagnosis not present

## 2018-07-22 DIAGNOSIS — E1022 Type 1 diabetes mellitus with diabetic chronic kidney disease: Secondary | ICD-10-CM | POA: Diagnosis not present

## 2018-07-22 DIAGNOSIS — H353121 Nonexudative age-related macular degeneration, left eye, early dry stage: Secondary | ICD-10-CM | POA: Diagnosis not present

## 2018-07-22 DIAGNOSIS — H35362 Drusen (degenerative) of macula, left eye: Secondary | ICD-10-CM | POA: Diagnosis not present

## 2018-08-02 DIAGNOSIS — M9904 Segmental and somatic dysfunction of sacral region: Secondary | ICD-10-CM | POA: Diagnosis not present

## 2018-08-02 DIAGNOSIS — M9901 Segmental and somatic dysfunction of cervical region: Secondary | ICD-10-CM | POA: Diagnosis not present

## 2018-08-02 DIAGNOSIS — M9903 Segmental and somatic dysfunction of lumbar region: Secondary | ICD-10-CM | POA: Diagnosis not present

## 2018-08-02 DIAGNOSIS — M5136 Other intervertebral disc degeneration, lumbar region: Secondary | ICD-10-CM | POA: Diagnosis not present

## 2018-08-04 DIAGNOSIS — G4731 Primary central sleep apnea: Secondary | ICD-10-CM | POA: Diagnosis not present

## 2018-08-09 DIAGNOSIS — M9904 Segmental and somatic dysfunction of sacral region: Secondary | ICD-10-CM | POA: Diagnosis not present

## 2018-08-09 DIAGNOSIS — M5136 Other intervertebral disc degeneration, lumbar region: Secondary | ICD-10-CM | POA: Diagnosis not present

## 2018-08-09 DIAGNOSIS — M9903 Segmental and somatic dysfunction of lumbar region: Secondary | ICD-10-CM | POA: Diagnosis not present

## 2018-08-09 DIAGNOSIS — M9901 Segmental and somatic dysfunction of cervical region: Secondary | ICD-10-CM | POA: Diagnosis not present

## 2018-08-11 DIAGNOSIS — E1022 Type 1 diabetes mellitus with diabetic chronic kidney disease: Secondary | ICD-10-CM | POA: Diagnosis not present

## 2018-08-11 DIAGNOSIS — N183 Chronic kidney disease, stage 3 (moderate): Secondary | ICD-10-CM | POA: Diagnosis not present

## 2018-08-11 DIAGNOSIS — E1042 Type 1 diabetes mellitus with diabetic polyneuropathy: Secondary | ICD-10-CM | POA: Diagnosis not present

## 2018-08-11 DIAGNOSIS — Z794 Long term (current) use of insulin: Secondary | ICD-10-CM | POA: Diagnosis not present

## 2018-08-12 DIAGNOSIS — E109 Type 1 diabetes mellitus without complications: Secondary | ICD-10-CM | POA: Diagnosis not present

## 2018-08-18 DIAGNOSIS — D649 Anemia, unspecified: Secondary | ICD-10-CM | POA: Diagnosis not present

## 2018-08-18 DIAGNOSIS — N183 Chronic kidney disease, stage 3 (moderate): Secondary | ICD-10-CM | POA: Diagnosis not present

## 2018-08-18 DIAGNOSIS — E211 Secondary hyperparathyroidism, not elsewhere classified: Secondary | ICD-10-CM | POA: Diagnosis not present

## 2018-08-18 DIAGNOSIS — I10 Essential (primary) hypertension: Secondary | ICD-10-CM | POA: Diagnosis not present

## 2018-08-18 DIAGNOSIS — E119 Type 2 diabetes mellitus without complications: Secondary | ICD-10-CM | POA: Diagnosis not present

## 2018-08-18 DIAGNOSIS — R309 Painful micturition, unspecified: Secondary | ICD-10-CM | POA: Diagnosis not present

## 2018-08-18 DIAGNOSIS — E669 Obesity, unspecified: Secondary | ICD-10-CM | POA: Diagnosis not present

## 2018-08-18 DIAGNOSIS — R809 Proteinuria, unspecified: Secondary | ICD-10-CM | POA: Diagnosis not present

## 2018-08-30 DIAGNOSIS — M5136 Other intervertebral disc degeneration, lumbar region: Secondary | ICD-10-CM | POA: Diagnosis not present

## 2018-08-30 DIAGNOSIS — M9901 Segmental and somatic dysfunction of cervical region: Secondary | ICD-10-CM | POA: Diagnosis not present

## 2018-08-30 DIAGNOSIS — M9904 Segmental and somatic dysfunction of sacral region: Secondary | ICD-10-CM | POA: Diagnosis not present

## 2018-08-30 DIAGNOSIS — M9903 Segmental and somatic dysfunction of lumbar region: Secondary | ICD-10-CM | POA: Diagnosis not present

## 2018-09-01 DIAGNOSIS — Z85828 Personal history of other malignant neoplasm of skin: Secondary | ICD-10-CM | POA: Diagnosis not present

## 2018-09-01 DIAGNOSIS — L821 Other seborrheic keratosis: Secondary | ICD-10-CM | POA: Diagnosis not present

## 2018-09-01 DIAGNOSIS — L814 Other melanin hyperpigmentation: Secondary | ICD-10-CM | POA: Diagnosis not present

## 2018-09-01 DIAGNOSIS — L570 Actinic keratosis: Secondary | ICD-10-CM | POA: Diagnosis not present

## 2018-09-13 DIAGNOSIS — E109 Type 1 diabetes mellitus without complications: Secondary | ICD-10-CM | POA: Diagnosis not present

## 2018-09-15 DIAGNOSIS — M5136 Other intervertebral disc degeneration, lumbar region: Secondary | ICD-10-CM | POA: Diagnosis not present

## 2018-09-15 DIAGNOSIS — M9901 Segmental and somatic dysfunction of cervical region: Secondary | ICD-10-CM | POA: Diagnosis not present

## 2018-09-15 DIAGNOSIS — M9904 Segmental and somatic dysfunction of sacral region: Secondary | ICD-10-CM | POA: Diagnosis not present

## 2018-09-15 DIAGNOSIS — M9903 Segmental and somatic dysfunction of lumbar region: Secondary | ICD-10-CM | POA: Diagnosis not present

## 2018-09-27 DIAGNOSIS — M5136 Other intervertebral disc degeneration, lumbar region: Secondary | ICD-10-CM | POA: Diagnosis not present

## 2018-09-27 DIAGNOSIS — M9903 Segmental and somatic dysfunction of lumbar region: Secondary | ICD-10-CM | POA: Diagnosis not present

## 2018-09-27 DIAGNOSIS — M9901 Segmental and somatic dysfunction of cervical region: Secondary | ICD-10-CM | POA: Diagnosis not present

## 2018-09-27 DIAGNOSIS — M9904 Segmental and somatic dysfunction of sacral region: Secondary | ICD-10-CM | POA: Diagnosis not present

## 2018-10-11 DIAGNOSIS — M9901 Segmental and somatic dysfunction of cervical region: Secondary | ICD-10-CM | POA: Diagnosis not present

## 2018-10-11 DIAGNOSIS — M9904 Segmental and somatic dysfunction of sacral region: Secondary | ICD-10-CM | POA: Diagnosis not present

## 2018-10-11 DIAGNOSIS — M9903 Segmental and somatic dysfunction of lumbar region: Secondary | ICD-10-CM | POA: Diagnosis not present

## 2018-10-11 DIAGNOSIS — M5136 Other intervertebral disc degeneration, lumbar region: Secondary | ICD-10-CM | POA: Diagnosis not present

## 2018-10-15 DIAGNOSIS — E109 Type 1 diabetes mellitus without complications: Secondary | ICD-10-CM | POA: Diagnosis not present

## 2018-10-21 ENCOUNTER — Other Ambulatory Visit: Payer: Self-pay

## 2018-10-21 ENCOUNTER — Ambulatory Visit: Payer: Medicare Other | Admitting: Adult Health

## 2018-10-21 VITALS — BP 135/74 | HR 85 | Temp 97.5°F | Ht 75.0 in | Wt 248.0 lb

## 2018-10-21 DIAGNOSIS — G4733 Obstructive sleep apnea (adult) (pediatric): Secondary | ICD-10-CM | POA: Diagnosis not present

## 2018-10-21 DIAGNOSIS — G4731 Primary central sleep apnea: Secondary | ICD-10-CM

## 2018-10-21 NOTE — Patient Instructions (Signed)
Continue using CPAP nightly and greater than 4 hours each night Try adjusting straps If your symptoms worsen or you develop new symptoms please let us know.

## 2018-10-21 NOTE — Progress Notes (Signed)
PATIENT: [name] DOB: 11/26/1950  REASON FOR VISIT: follow up HISTORY FROM: patient  HISTORY OF PRESENT ILLNESS: Today 10/21/18:  Mr. [name] is a 68 year old male with a history of obstructive sleep apnea on ASV.  His download indicates that he use his machine nightly for compliance of 100%.  He uses machine greater than 4 hours each night.  On average he uses his machine 8 hours and 15 minutes.  His residual AHI is 9.3 on a minimum pressure support of 4 cm of water and maximum pressure support of 10 cm of water. his leak in the 95th percentile is 64.1 L/min.  He has had several mask refittings but continues to have a significant leak.  He states that the current mask tends to fit the best.  He states that the issue is with the straps.  They typically slip off his head very easily.  He states that he does readjust it during the night.  He finds the CPAP beneficial.  He does use it when he takes naps.    REVIEW OF SYSTEMS: Out of a complete 14 system review of symptoms, the patient complains only of the following symptoms, and all other reviewed systems are negative.  Epworth sleepiness score 12 fatigue severity score 41  ALLERGIES: Allergies  Allergen Reactions  . Codeine Nausea And Vomiting    Last time, came to ED  . Dilaudid [Hydromorphone Hcl] Anaphylaxis    Patient goes into cardiac arrest  . Gluten Meal Other (See Comments)    Severe migraines  . Morphine And Related Anaphylaxis  . Toradol [Ketorolac Tromethamine] Nausea And Vomiting  . Penicillins Other (See Comments)    Unknown     HOME MEDICATIONS: Outpatient Medications Prior to Visit  Medication Sig Dispense Refill  . amLODipine (NORVASC) 5 MG tablet Take 5 mg by mouth daily.    . butalbital-aspirin-caffeine (FIORINAL) 50-325-40 MG capsule TAKE 1 CAPSULE BY MOUTH EVERY 4 HOURS AS NEEDED FOR SEVERE MIGRAINE    . CINNAMON PO Take 2 tablets by mouth daily.    . citalopram (CELEXA) 20 MG tablet Take 20 mg  by mouth daily.    . cloNIDine (CATAPRES) 0.1 MG tablet Take 0.1 mg by mouth daily as needed (for high BP).    [name] diclofenac sodium (VOLTAREN) 1 % GEL APPLY 2 GRAMS EVERY 6 HOURS AS NEEDED FOR SHOULDER PAIN    . fluocinonide cream (LIDEX) 0.05 %   2  . HUMALOG 100 UNIT/ML injection USE 90 UNITS DAY VIA INSULIN PUMP SUBCUTANEOUS    . hydrochlorothiazide (MICROZIDE) 12.5 MG capsule Take 25 mg by mouth daily.     [name] ibuprofen (ADVIL,MOTRIN) 200 MG tablet Take 200-400 mg by mouth every 6 (six) hours as needed for pain.    . Insulin Human (INSULIN PUMP) 100 unit/ml SOLN Inject 70 each into the skin continuous. Humalog insulin    . lisinopril (ZESTRIL) 10 MG tablet Take 10 mg by mouth daily.    [name] LISINOPRIL PO Take by mouth.    . Multiple Vitamin (MULTIVITAMIN WITH MINERALS) TABS tablet Take 1 tablet by mouth daily.    [name] omeprazole (PRILOSEC) 40 MG capsule Take by mouth.    Vladimir Faster Glycol-Propyl Glycol (SYSTANE) 0.4-0.3 % SOLN Apply 2 drops to eye 3 (three) times daily.    . vitamin C (ASCORBIC ACID) 500 MG tablet Take 500 mg by mouth daily.     No facility-administered medications prior to visit.     PAST MEDICAL  HISTORY: Past Medical History:  Diagnosis Date  . Anxiety   . Arthritis   . Diabetes mellitus without complication ([place])   . Head injury 09/16/12   12 stitches  . Hypertension   . Migraine   . 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 OSA (obstructive sleep apnea)   . OSA treated with BiPAP   . Sleep apnea with use of continuous positive airway pressure (CPAP)    sleep study revealed centrals, adapt SV EPAP at 10 cm water 02-19-11     PAST SURGICAL HISTORY: Past Surgical History:  Procedure Laterality Date  . COLON SURGERY  2010   collapsed colon  . TRIGGER FINGER RELEASE     x4    FAMILY HISTORY: Family History  Problem Relation Age of Onset  . Arthritis Sister   . Sleep apnea Brother   .  Diabetes Other   . Heart failure Mother   . Diabetes Maternal Uncle     SOCIAL HISTORY: Social History   Socioeconomic History  . Marital status: Married    Spouse name: [name]  . Number of children: 1  . Years of education: 75  . 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 Financial resource strain: Not on file  . Food insecurity    Worry: Not on file    Inability: Not on file  . Transportation needs    Medical: Not on file    Non-medical: Not on file  Tobacco Use  . Smoking status: Never Smoker  . Smokeless tobacco: Never Used  Substance and Sexual Activity  . Alcohol use: No  . Drug use: No  . Sexual activity: Not on file  Lifestyle  . Physical activity    Days per week: Not on file    Minutes per session: Not on file  . Stress: Not on file  Relationships  . Social Herbalist on phone: Not on file    Gets together: Not on file    Attends religious service: Not on file    Active member of club or organization: Not on file    Attends meetings of clubs or organizations: Not on file    Relationship status: Not on file  . Intimate partner violence    Fear of current or ex partner: Not on file    Emotionally abused: Not on file    Physically abused: Not on file    Forced sexual activity: Not on file  Other Topics Concern  . Not on file  Social History Narrative   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   Patient has one adult child.   Patient is working part-time and is retired.   Patient has a Haematologist.   Patient is right-handed.   Patient drinks four cups of coffee daily.      PHYSICAL EXAM  Vitals:   10/21/18 0826  BP: 135/74  Pulse: 85  Temp: (!) 97.5 F (36.4 C)  TempSrc: Oral  Weight: 248 lb (112.5 kg)  Height: 6' 3"  (1.905 m)   Body mass index is 31 kg/m.   Generalized: Well developed, in no acute distress  Chest: Lungs clear to  auscultation bilaterally  Neurological examination  Mentation: Alert oriented to time, place, history taking. Follows all commands speech and language fluent Cranial nerve II-XII: Extraocular  movements were full, visual field were full on confrontational test Head turning and shoulder shrug  were normal and symmetric. Motor: The motor testing reveals 5 over 5 strength of all 4 extremities. Good symmetric motor tone is noted throughout.  Sensory: Sensory testing is intact to soft touch on all 4 extremities. No evidence of extinction is noted.  Gait and station: Gait is normal.     DIAGNOSTIC DATA (LABS, IMAGING, TESTING) - I reviewed patient records, labs, notes, testing and imaging myself where available.  Lab Results  Component Value Date   WBC 9.7 10/27/2011   HGB 12.8 (L) 10/27/2011   HCT 36.6 (L) 10/27/2011   MCV 83.2 10/27/2011   PLT 204 10/27/2011      Component Value Date/Time   NA 139 10/27/2011 2340   K 4.6 10/27/2011 2340   CL 100 10/27/2011 2340   CO2 27 10/27/2011 2340   GLUCOSE 206 (H) 10/27/2011 2340   BUN 29 (H) 10/27/2011 2340   CREATININE 1.80 (H) 10/27/2011 2340   CALCIUM 9.6 10/27/2011 2340   PROT 6.8 01/30/2007 0430   ALBUMIN 3.7 01/30/2007 0430   AST 25 01/30/2007 0430   ALT 32 01/30/2007 0430   ALKPHOS 114 01/30/2007 0430   BILITOT 0.8 01/30/2007 0430   GFRNONAA 39 (L) 10/27/2011 2340   GFRAA 45 (L) 10/27/2011 2340   No results found for: CHOL, HDL, LDLCALC, LDLDIRECT, TRIG, CHOLHDL No results found for: HGBA1C No results found for: VITAMINB12 No results found for: TSH    ASSESSMENT AND PLAN 68 y.o. 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 here with:  1.  Obstructive sleep apnea on ASV  The patient's download shows excellent compliance.  His residual AHI is slightly elevated however he has a significant leak.  He has been for several mask recordings in the past.  At this point I do not think another mask refitting would offer much benefit.  I have instructed the patient that he should make sure his straps are tight enough that they do not sleep off of his head easily.  He voiced understanding.  He will follow-up in 1 year or sooner if needed.   I spent 15 minutes with the patient. 50% of this time was spent reviewing plan of care   Ward Givens, MSN, NP-C 10/21/2018, 8:40 AM [facility] [address], [place], [place] [address] [phone]

## 2018-10-22 DIAGNOSIS — E1021 Type 1 diabetes mellitus with diabetic nephropathy: Secondary | ICD-10-CM | POA: Diagnosis not present

## 2018-10-22 DIAGNOSIS — E1022 Type 1 diabetes mellitus with diabetic chronic kidney disease: Secondary | ICD-10-CM | POA: Diagnosis not present

## 2018-10-25 DIAGNOSIS — M9903 Segmental and somatic dysfunction of lumbar region: Secondary | ICD-10-CM | POA: Diagnosis not present

## 2018-10-25 DIAGNOSIS — M9904 Segmental and somatic dysfunction of sacral region: Secondary | ICD-10-CM | POA: Diagnosis not present

## 2018-10-25 DIAGNOSIS — M9901 Segmental and somatic dysfunction of cervical region: Secondary | ICD-10-CM | POA: Diagnosis not present

## 2018-10-25 DIAGNOSIS — M5136 Other intervertebral disc degeneration, lumbar region: Secondary | ICD-10-CM | POA: Diagnosis not present

## 2018-11-10 DIAGNOSIS — M9901 Segmental and somatic dysfunction of cervical region: Secondary | ICD-10-CM | POA: Diagnosis not present

## 2018-11-10 DIAGNOSIS — M9903 Segmental and somatic dysfunction of lumbar region: Secondary | ICD-10-CM | POA: Diagnosis not present

## 2018-11-10 DIAGNOSIS — M9904 Segmental and somatic dysfunction of sacral region: Secondary | ICD-10-CM | POA: Diagnosis not present

## 2018-11-10 DIAGNOSIS — M5136 Other intervertebral disc degeneration, lumbar region: Secondary | ICD-10-CM | POA: Diagnosis not present

## 2018-11-19 DIAGNOSIS — E1042 Type 1 diabetes mellitus with diabetic polyneuropathy: Secondary | ICD-10-CM | POA: Diagnosis not present

## 2018-11-19 DIAGNOSIS — Z794 Long term (current) use of insulin: Secondary | ICD-10-CM | POA: Diagnosis not present

## 2018-11-19 DIAGNOSIS — N1831 Chronic kidney disease, stage 3a: Secondary | ICD-10-CM | POA: Diagnosis not present

## 2018-11-19 DIAGNOSIS — E109 Type 1 diabetes mellitus without complications: Secondary | ICD-10-CM | POA: Diagnosis not present

## 2018-11-19 DIAGNOSIS — E1022 Type 1 diabetes mellitus with diabetic chronic kidney disease: Secondary | ICD-10-CM | POA: Diagnosis not present

## 2018-11-23 DIAGNOSIS — E1040 Type 1 diabetes mellitus with diabetic neuropathy, unspecified: Secondary | ICD-10-CM | POA: Diagnosis not present

## 2018-11-23 DIAGNOSIS — Z125 Encounter for screening for malignant neoplasm of prostate: Secondary | ICD-10-CM | POA: Diagnosis not present

## 2018-11-23 DIAGNOSIS — I10 Essential (primary) hypertension: Secondary | ICD-10-CM | POA: Diagnosis not present

## 2018-11-30 DIAGNOSIS — Z0000 Encounter for general adult medical examination without abnormal findings: Secondary | ICD-10-CM | POA: Diagnosis not present

## 2018-11-30 DIAGNOSIS — E785 Hyperlipidemia, unspecified: Secondary | ICD-10-CM | POA: Diagnosis not present

## 2018-11-30 DIAGNOSIS — E1029 Type 1 diabetes mellitus with other diabetic kidney complication: Secondary | ICD-10-CM | POA: Diagnosis not present

## 2018-11-30 DIAGNOSIS — I10 Essential (primary) hypertension: Secondary | ICD-10-CM | POA: Diagnosis not present

## 2018-11-30 DIAGNOSIS — Z1331 Encounter for screening for depression: Secondary | ICD-10-CM | POA: Diagnosis not present

## 2018-12-01 DIAGNOSIS — M5136 Other intervertebral disc degeneration, lumbar region: Secondary | ICD-10-CM | POA: Diagnosis not present

## 2018-12-01 DIAGNOSIS — M9903 Segmental and somatic dysfunction of lumbar region: Secondary | ICD-10-CM | POA: Diagnosis not present

## 2018-12-01 DIAGNOSIS — M5030 Other cervical disc degeneration, unspecified cervical region: Secondary | ICD-10-CM | POA: Diagnosis not present

## 2018-12-01 DIAGNOSIS — M9901 Segmental and somatic dysfunction of cervical region: Secondary | ICD-10-CM | POA: Diagnosis not present

## 2018-12-15 DIAGNOSIS — M9903 Segmental and somatic dysfunction of lumbar region: Secondary | ICD-10-CM | POA: Diagnosis not present

## 2018-12-15 DIAGNOSIS — M9901 Segmental and somatic dysfunction of cervical region: Secondary | ICD-10-CM | POA: Diagnosis not present

## 2018-12-15 DIAGNOSIS — M5030 Other cervical disc degeneration, unspecified cervical region: Secondary | ICD-10-CM | POA: Diagnosis not present

## 2018-12-15 DIAGNOSIS — M5136 Other intervertebral disc degeneration, lumbar region: Secondary | ICD-10-CM | POA: Diagnosis not present

## 2018-12-18 DIAGNOSIS — E109 Type 1 diabetes mellitus without complications: Secondary | ICD-10-CM | POA: Diagnosis not present

## 2019-01-05 DIAGNOSIS — M9901 Segmental and somatic dysfunction of cervical region: Secondary | ICD-10-CM | POA: Diagnosis not present

## 2019-01-05 DIAGNOSIS — M9903 Segmental and somatic dysfunction of lumbar region: Secondary | ICD-10-CM | POA: Diagnosis not present

## 2019-01-05 DIAGNOSIS — M5136 Other intervertebral disc degeneration, lumbar region: Secondary | ICD-10-CM | POA: Diagnosis not present

## 2019-01-05 DIAGNOSIS — M5030 Other cervical disc degeneration, unspecified cervical region: Secondary | ICD-10-CM | POA: Diagnosis not present

## 2019-01-19 DIAGNOSIS — E109 Type 1 diabetes mellitus without complications: Secondary | ICD-10-CM | POA: Diagnosis not present

## 2019-01-24 ENCOUNTER — Ambulatory Visit: Payer: Worker's Compensation | Attending: Internal Medicine

## 2019-01-24 DIAGNOSIS — H3561 Retinal hemorrhage, right eye: Secondary | ICD-10-CM | POA: Diagnosis not present

## 2019-01-24 DIAGNOSIS — Z20822 Contact with and (suspected) exposure to covid-19: Secondary | ICD-10-CM

## 2019-01-24 DIAGNOSIS — H348311 Tributary (branch) retinal vein occlusion, right eye, with retinal neovascularization: Secondary | ICD-10-CM | POA: Diagnosis not present

## 2019-01-24 DIAGNOSIS — H3582 Retinal ischemia: Secondary | ICD-10-CM | POA: Diagnosis not present

## 2019-01-24 DIAGNOSIS — E113593 Type 2 diabetes mellitus with proliferative diabetic retinopathy without macular edema, bilateral: Secondary | ICD-10-CM | POA: Diagnosis not present

## 2019-01-25 LAB — NOVEL CORONAVIRUS, NAA: SARS-CoV-2, NAA: NOT DETECTED

## 2019-01-26 DIAGNOSIS — E1021 Type 1 diabetes mellitus with diabetic nephropathy: Secondary | ICD-10-CM | POA: Diagnosis not present

## 2019-01-26 DIAGNOSIS — E1022 Type 1 diabetes mellitus with diabetic chronic kidney disease: Secondary | ICD-10-CM | POA: Diagnosis not present

## 2019-02-04 DIAGNOSIS — H348311 Tributary (branch) retinal vein occlusion, right eye, with retinal neovascularization: Secondary | ICD-10-CM | POA: Diagnosis not present

## 2019-02-17 DIAGNOSIS — E1022 Type 1 diabetes mellitus with diabetic chronic kidney disease: Secondary | ICD-10-CM | POA: Diagnosis not present

## 2019-02-17 DIAGNOSIS — Z794 Long term (current) use of insulin: Secondary | ICD-10-CM | POA: Diagnosis not present

## 2019-02-17 DIAGNOSIS — E1042 Type 1 diabetes mellitus with diabetic polyneuropathy: Secondary | ICD-10-CM | POA: Diagnosis not present

## 2019-02-17 DIAGNOSIS — N1831 Chronic kidney disease, stage 3a: Secondary | ICD-10-CM | POA: Diagnosis not present

## 2019-02-22 DIAGNOSIS — E109 Type 1 diabetes mellitus without complications: Secondary | ICD-10-CM | POA: Diagnosis not present

## 2019-03-10 DIAGNOSIS — H348311 Tributary (branch) retinal vein occlusion, right eye, with retinal neovascularization: Secondary | ICD-10-CM | POA: Diagnosis not present

## 2019-03-23 DIAGNOSIS — E109 Type 1 diabetes mellitus without complications: Secondary | ICD-10-CM | POA: Diagnosis not present

## 2019-04-18 DIAGNOSIS — M9901 Segmental and somatic dysfunction of cervical region: Secondary | ICD-10-CM | POA: Diagnosis not present

## 2019-04-18 DIAGNOSIS — M5136 Other intervertebral disc degeneration, lumbar region: Secondary | ICD-10-CM | POA: Diagnosis not present

## 2019-04-18 DIAGNOSIS — M9904 Segmental and somatic dysfunction of sacral region: Secondary | ICD-10-CM | POA: Diagnosis not present

## 2019-04-18 DIAGNOSIS — M5030 Other cervical disc degeneration, unspecified cervical region: Secondary | ICD-10-CM | POA: Diagnosis not present

## 2019-04-26 DIAGNOSIS — E1022 Type 1 diabetes mellitus with diabetic chronic kidney disease: Secondary | ICD-10-CM | POA: Diagnosis not present

## 2019-04-26 DIAGNOSIS — E119 Type 2 diabetes mellitus without complications: Secondary | ICD-10-CM | POA: Diagnosis not present

## 2019-04-26 DIAGNOSIS — Z794 Long term (current) use of insulin: Secondary | ICD-10-CM | POA: Diagnosis not present

## 2019-04-26 DIAGNOSIS — E1021 Type 1 diabetes mellitus with diabetic nephropathy: Secondary | ICD-10-CM | POA: Diagnosis not present

## 2019-05-18 DIAGNOSIS — N183 Chronic kidney disease, stage 3 unspecified: Secondary | ICD-10-CM | POA: Diagnosis not present

## 2019-05-18 DIAGNOSIS — E1022 Type 1 diabetes mellitus with diabetic chronic kidney disease: Secondary | ICD-10-CM | POA: Diagnosis not present

## 2019-05-18 DIAGNOSIS — E1042 Type 1 diabetes mellitus with diabetic polyneuropathy: Secondary | ICD-10-CM | POA: Diagnosis not present

## 2019-05-18 DIAGNOSIS — Z794 Long term (current) use of insulin: Secondary | ICD-10-CM | POA: Diagnosis not present

## 2019-05-24 DIAGNOSIS — D0461 Carcinoma in situ of skin of right upper limb, including shoulder: Secondary | ICD-10-CM | POA: Diagnosis not present

## 2019-05-24 DIAGNOSIS — Z85828 Personal history of other malignant neoplasm of skin: Secondary | ICD-10-CM | POA: Diagnosis not present

## 2019-05-24 DIAGNOSIS — L814 Other melanin hyperpigmentation: Secondary | ICD-10-CM | POA: Diagnosis not present

## 2019-05-24 DIAGNOSIS — D225 Melanocytic nevi of trunk: Secondary | ICD-10-CM | POA: Diagnosis not present

## 2019-05-24 DIAGNOSIS — L821 Other seborrheic keratosis: Secondary | ICD-10-CM | POA: Diagnosis not present

## 2019-05-25 DIAGNOSIS — H348311 Tributary (branch) retinal vein occlusion, right eye, with retinal neovascularization: Secondary | ICD-10-CM | POA: Diagnosis not present

## 2019-06-02 DIAGNOSIS — E109 Type 1 diabetes mellitus without complications: Secondary | ICD-10-CM | POA: Diagnosis not present

## 2019-06-29 DIAGNOSIS — H348311 Tributary (branch) retinal vein occlusion, right eye, with retinal neovascularization: Secondary | ICD-10-CM | POA: Diagnosis not present

## 2019-06-29 DIAGNOSIS — G4731 Primary central sleep apnea: Secondary | ICD-10-CM | POA: Diagnosis not present

## 2019-07-26 DIAGNOSIS — E119 Type 2 diabetes mellitus without complications: Secondary | ICD-10-CM | POA: Diagnosis not present

## 2019-07-26 DIAGNOSIS — E1022 Type 1 diabetes mellitus with diabetic chronic kidney disease: Secondary | ICD-10-CM | POA: Diagnosis not present

## 2019-07-26 DIAGNOSIS — E1021 Type 1 diabetes mellitus with diabetic nephropathy: Secondary | ICD-10-CM | POA: Diagnosis not present

## 2019-07-26 DIAGNOSIS — Z794 Long term (current) use of insulin: Secondary | ICD-10-CM | POA: Diagnosis not present

## 2019-08-03 DIAGNOSIS — H348311 Tributary (branch) retinal vein occlusion, right eye, with retinal neovascularization: Secondary | ICD-10-CM | POA: Diagnosis not present

## 2019-08-18 DIAGNOSIS — Z794 Long term (current) use of insulin: Secondary | ICD-10-CM | POA: Diagnosis not present

## 2019-08-18 DIAGNOSIS — E1042 Type 1 diabetes mellitus with diabetic polyneuropathy: Secondary | ICD-10-CM | POA: Diagnosis not present

## 2019-08-18 DIAGNOSIS — N1831 Chronic kidney disease, stage 3a: Secondary | ICD-10-CM | POA: Diagnosis not present

## 2019-08-18 DIAGNOSIS — E1022 Type 1 diabetes mellitus with diabetic chronic kidney disease: Secondary | ICD-10-CM | POA: Diagnosis not present

## 2019-08-29 DIAGNOSIS — I129 Hypertensive chronic kidney disease with stage 1 through stage 4 chronic kidney disease, or unspecified chronic kidney disease: Secondary | ICD-10-CM | POA: Diagnosis not present

## 2019-08-29 DIAGNOSIS — R809 Proteinuria, unspecified: Secondary | ICD-10-CM | POA: Diagnosis not present

## 2019-08-29 DIAGNOSIS — N1832 Chronic kidney disease, stage 3b: Secondary | ICD-10-CM | POA: Diagnosis not present

## 2019-08-29 DIAGNOSIS — D631 Anemia in chronic kidney disease: Secondary | ICD-10-CM | POA: Diagnosis not present

## 2019-08-31 DIAGNOSIS — E109 Type 1 diabetes mellitus without complications: Secondary | ICD-10-CM | POA: Diagnosis not present

## 2019-09-02 ENCOUNTER — Other Ambulatory Visit: Payer: Self-pay | Admitting: Nephrology

## 2019-09-02 DIAGNOSIS — N1831 Chronic kidney disease, stage 3a: Secondary | ICD-10-CM

## 2019-09-07 DIAGNOSIS — H348311 Tributary (branch) retinal vein occlusion, right eye, with retinal neovascularization: Secondary | ICD-10-CM | POA: Diagnosis not present

## 2019-09-09 DIAGNOSIS — M9904 Segmental and somatic dysfunction of sacral region: Secondary | ICD-10-CM | POA: Diagnosis not present

## 2019-09-09 DIAGNOSIS — M9903 Segmental and somatic dysfunction of lumbar region: Secondary | ICD-10-CM | POA: Diagnosis not present

## 2019-09-09 DIAGNOSIS — M5136 Other intervertebral disc degeneration, lumbar region: Secondary | ICD-10-CM | POA: Diagnosis not present

## 2019-09-09 DIAGNOSIS — M9901 Segmental and somatic dysfunction of cervical region: Secondary | ICD-10-CM | POA: Diagnosis not present

## 2019-09-14 ENCOUNTER — Ambulatory Visit
Admission: RE | Admit: 2019-09-14 | Discharge: 2019-09-14 | Disposition: A | Discharge status: Home / Self Care | Payer: Medicare Other | Source: Ambulatory Visit | Attending: Nephrology | Admitting: Nephrology

## 2019-09-14 DIAGNOSIS — N183 Chronic kidney disease, stage 3 unspecified: Secondary | ICD-10-CM | POA: Diagnosis not present

## 2019-09-14 DIAGNOSIS — N1831 Chronic kidney disease, stage 3a: Secondary | ICD-10-CM

## 2019-09-15 DIAGNOSIS — M79641 Pain in right hand: Secondary | ICD-10-CM | POA: Diagnosis not present

## 2019-09-15 DIAGNOSIS — M65352 Trigger finger, left little finger: Secondary | ICD-10-CM | POA: Diagnosis not present

## 2019-09-15 DIAGNOSIS — M79642 Pain in left hand: Secondary | ICD-10-CM | POA: Diagnosis not present

## 2019-09-15 DIAGNOSIS — M65321 Trigger finger, right index finger: Secondary | ICD-10-CM | POA: Diagnosis not present

## 2019-09-30 DIAGNOSIS — E109 Type 1 diabetes mellitus without complications: Secondary | ICD-10-CM | POA: Diagnosis not present

## 2019-10-24 ENCOUNTER — Ambulatory Visit: Payer: Medicare Other | Admitting: Adult Health

## 2019-10-26 DIAGNOSIS — M65321 Trigger finger, right index finger: Secondary | ICD-10-CM | POA: Diagnosis not present

## 2019-10-26 DIAGNOSIS — M79641 Pain in right hand: Secondary | ICD-10-CM | POA: Diagnosis not present

## 2019-10-26 DIAGNOSIS — M79642 Pain in left hand: Secondary | ICD-10-CM | POA: Diagnosis not present

## 2019-10-26 DIAGNOSIS — M65352 Trigger finger, left little finger: Secondary | ICD-10-CM | POA: Diagnosis not present

## 2019-10-26 DIAGNOSIS — E1021 Type 1 diabetes mellitus with diabetic nephropathy: Secondary | ICD-10-CM | POA: Diagnosis not present

## 2019-10-26 DIAGNOSIS — Z794 Long term (current) use of insulin: Secondary | ICD-10-CM | POA: Diagnosis not present

## 2019-10-26 DIAGNOSIS — E1022 Type 1 diabetes mellitus with diabetic chronic kidney disease: Secondary | ICD-10-CM | POA: Diagnosis not present

## 2019-10-26 DIAGNOSIS — E119 Type 2 diabetes mellitus without complications: Secondary | ICD-10-CM | POA: Diagnosis not present

## 2019-10-29 DIAGNOSIS — E109 Type 1 diabetes mellitus without complications: Secondary | ICD-10-CM | POA: Diagnosis not present

## 2019-10-31 DIAGNOSIS — H348311 Tributary (branch) retinal vein occlusion, right eye, with retinal neovascularization: Secondary | ICD-10-CM | POA: Diagnosis not present

## 2019-11-18 DIAGNOSIS — N1832 Chronic kidney disease, stage 3b: Secondary | ICD-10-CM | POA: Diagnosis not present

## 2019-11-18 DIAGNOSIS — Z794 Long term (current) use of insulin: Secondary | ICD-10-CM | POA: Diagnosis not present

## 2019-11-18 DIAGNOSIS — E1022 Type 1 diabetes mellitus with diabetic chronic kidney disease: Secondary | ICD-10-CM | POA: Diagnosis not present

## 2019-11-18 DIAGNOSIS — E1042 Type 1 diabetes mellitus with diabetic polyneuropathy: Secondary | ICD-10-CM | POA: Diagnosis not present

## 2019-11-23 DIAGNOSIS — M79641 Pain in right hand: Secondary | ICD-10-CM | POA: Diagnosis not present

## 2019-11-23 DIAGNOSIS — M65352 Trigger finger, left little finger: Secondary | ICD-10-CM | POA: Diagnosis not present

## 2019-11-23 DIAGNOSIS — M79642 Pain in left hand: Secondary | ICD-10-CM | POA: Diagnosis not present

## 2019-11-30 DIAGNOSIS — N1832 Chronic kidney disease, stage 3b: Secondary | ICD-10-CM | POA: Diagnosis not present

## 2019-11-30 DIAGNOSIS — D631 Anemia in chronic kidney disease: Secondary | ICD-10-CM | POA: Diagnosis not present

## 2019-11-30 DIAGNOSIS — R809 Proteinuria, unspecified: Secondary | ICD-10-CM | POA: Diagnosis not present

## 2019-11-30 DIAGNOSIS — I129 Hypertensive chronic kidney disease with stage 1 through stage 4 chronic kidney disease, or unspecified chronic kidney disease: Secondary | ICD-10-CM | POA: Diagnosis not present

## 2019-12-02 DIAGNOSIS — Z125 Encounter for screening for malignant neoplasm of prostate: Secondary | ICD-10-CM | POA: Diagnosis not present

## 2019-12-02 DIAGNOSIS — E1029 Type 1 diabetes mellitus with other diabetic kidney complication: Secondary | ICD-10-CM | POA: Diagnosis not present

## 2019-12-02 DIAGNOSIS — E785 Hyperlipidemia, unspecified: Secondary | ICD-10-CM | POA: Diagnosis not present

## 2019-12-03 DIAGNOSIS — E109 Type 1 diabetes mellitus without complications: Secondary | ICD-10-CM | POA: Diagnosis not present

## 2019-12-07 DIAGNOSIS — H348311 Tributary (branch) retinal vein occlusion, right eye, with retinal neovascularization: Secondary | ICD-10-CM | POA: Diagnosis not present

## 2019-12-09 DIAGNOSIS — E1029 Type 1 diabetes mellitus with other diabetic kidney complication: Secondary | ICD-10-CM | POA: Diagnosis not present

## 2019-12-09 DIAGNOSIS — Z1212 Encounter for screening for malignant neoplasm of rectum: Secondary | ICD-10-CM | POA: Diagnosis not present

## 2019-12-09 DIAGNOSIS — I10 Essential (primary) hypertension: Secondary | ICD-10-CM | POA: Diagnosis not present

## 2019-12-09 DIAGNOSIS — E1039 Type 1 diabetes mellitus with other diabetic ophthalmic complication: Secondary | ICD-10-CM | POA: Diagnosis not present

## 2019-12-09 DIAGNOSIS — R82998 Other abnormal findings in urine: Secondary | ICD-10-CM | POA: Diagnosis not present

## 2019-12-09 DIAGNOSIS — Z0000 Encounter for general adult medical examination without abnormal findings: Secondary | ICD-10-CM | POA: Diagnosis not present

## 2019-12-09 DIAGNOSIS — E1040 Type 1 diabetes mellitus with diabetic neuropathy, unspecified: Secondary | ICD-10-CM | POA: Diagnosis not present

## 2019-12-14 DIAGNOSIS — E113512 Type 2 diabetes mellitus with proliferative diabetic retinopathy with macular edema, left eye: Secondary | ICD-10-CM | POA: Diagnosis not present

## 2019-12-14 DIAGNOSIS — H3561 Retinal hemorrhage, right eye: Secondary | ICD-10-CM | POA: Diagnosis not present

## 2019-12-14 DIAGNOSIS — H3582 Retinal ischemia: Secondary | ICD-10-CM | POA: Diagnosis not present

## 2019-12-14 DIAGNOSIS — H34831 Tributary (branch) retinal vein occlusion, right eye, with macular edema: Secondary | ICD-10-CM | POA: Diagnosis not present

## 2019-12-26 DIAGNOSIS — G4731 Primary central sleep apnea: Secondary | ICD-10-CM | POA: Diagnosis not present

## 2020-01-11 ENCOUNTER — Ambulatory Visit: Payer: Medicare Other | Admitting: Adult Health

## 2020-01-11 DIAGNOSIS — H348311 Tributary (branch) retinal vein occlusion, right eye, with retinal neovascularization: Secondary | ICD-10-CM | POA: Diagnosis not present

## 2020-01-17 ENCOUNTER — Ambulatory Visit: Payer: Medicare Other | Admitting: Neurology

## 2020-01-17 ENCOUNTER — Other Ambulatory Visit: Payer: Self-pay

## 2020-01-17 ENCOUNTER — Telehealth: Payer: Self-pay | Admitting: Neurology

## 2020-01-17 ENCOUNTER — Encounter: Payer: Self-pay | Admitting: Neurology

## 2020-01-17 VITALS — BP 144/87 | HR 75 | Wt 237.5 lb

## 2020-01-17 DIAGNOSIS — G47419 Narcolepsy without cataplexy: Secondary | ICD-10-CM | POA: Diagnosis not present

## 2020-01-17 DIAGNOSIS — G3184 Mild cognitive impairment, so stated: Secondary | ICD-10-CM

## 2020-01-17 DIAGNOSIS — G4731 Primary central sleep apnea: Secondary | ICD-10-CM | POA: Diagnosis not present

## 2020-01-17 DIAGNOSIS — R9082 White matter disease, unspecified: Secondary | ICD-10-CM

## 2020-01-17 MED ORDER — MODAFINIL 100 MG PO TABS: 100.0000 mg | ORAL_TABLET | Freq: Every day | ORAL | 5 refills | Status: DC

## 2020-01-17 NOTE — Progress Notes (Signed)
[facility]   SLEEP MEDICINE CLINIC   Provider:    , MD   Referring Provider: Shaw, William, MD Primary Care Physician:  Shaw, William, MD   [name] is an established patient in our sleep clinic, 70 year-old, DM and ASV-PAP user.  His wife suffers form MSA and is on Carbidopa - Levodopa and has developed delusions , hallucinations and dementia.  Her husband feels the third vaccine dose sent her into a tail spin, reportedly she fell, disoriented and trembling worse.  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  [facility] [employer] is his DME- home health care is set up. ST. OT, PT is set up.   Epworth score was endorsed 13/ 24 points, FSS at 30/63 points.  The patient has been highly compliant, 1005 for ASV at minimum PS at 4 . Maximum PS at 10 cm water with 10 cm EEP.  High air leaks, AHI 9/h with undeterminated type of apnea- I think related to air leaks. [name] has apnea index of 1.7/h and hypopnea index of 7.3 and an air leak at the 95th percentile of 47.2 L/min.  I think his air leak but gives us erroneous counts however he has significantly reduced overall apnea index disease in comparison to his baseline study.  He is highly compliant with an average use at time of 9 hours and 19 minutes.  He definitely has more interruptions and fragmented sleep due to his caretaker role. He naps with ASV machine in place.        [name] is a 70 y.o. [place] born, caucasian, married male - he has been my patient since 2007. He has complex sleep apnea with hypersomnia. He is  insulin dependent DM,and continues to work as a painter, illustrator, and framer. He does this more as a hobby. He is a CPAP user, his machine is from 2013 an needs replaced. He is worried about progressive memory loss.  The compliance for the current VPAP adapt S9 has been excellent 100% as it has been in the years before.  [name] uses the ASV with an inspiratory pressure system of  10 cmH2O minimum pressure support of 4 maximum pressure support of 10 cm and has a residual AHI of 6.4.  He does have a lot of air leakage and he states that the headgear is not necessarily tight enough.     08-26-2016 ,  He is seen here as a  revisit  from Dr. Douglas Shaw, his internist, for Sleep apnea and BiPAP- therapy follow up. [name] is a highly compliant user of ASV, expiratory pressure 10 cm water, minimum pressure support 4, maximum pressure support 10 cm water he has a residual hypotony index of 7.7 which is higher than in the past 12 months. However he still has much better resolution of apnea in spite of high air leaks than he had untreated. I do think that air leaks cause some of his respiratory events. His minute ventilation, respiratory rate and tidal volume have not varied greatly. Since air leaks seem to be the main problem here, I do not need to adjust his settings. The patient's Epworth sleepiness score today was endorsed at 9 points, fatigue severity at 43 and geriatric depression score 3 out of 15 points. This all is better than anytime else in the last 18 months.  He still reports memory decline-  And he is not accepting the diagnosis of narcolepsy. Next visit in 6 month with MMSE   or MOCA.  He had the following questions; [name] is concerned that his water reservoir's bottom seems to be deteriorating, may be rusting. The patient is followed by advanced home care. I for him to make an appointment and show his machine to the respiratory tech.    02-2016 CD He has plans to visit the country of his birth and wants to make sure that BiPAP can be used on flight and at destination. He has a long history of DM type 1 .  Established patient - since 2012, right-handed married  gentleman, with a history of complex apnea.  Had headaches before treatment with BiPAP, his HbA1c have been in the low 6 range. Memory has declined. His high fatigue and Epworth scores remained  high after BiPAP. He was tested for narcolepsy by HLA: positive ! He just left for a journey to [place] when we got these result, and we meet now after his return. He endorses today Epworth at 9, feels rested and restored. He feels that he can drive and is alert for hours, no sleep attacks.  Does he need medications ? No, or not yet. Explained the genetic and autoimmune barrier to narcolepsy.   Last visit note CD.   He had sleep complains of persistent excessive daytime sleepiness despite nightly use of CPAP associated with restless legs and snoring. The patient also has a long-standing history of type 1 diabetes. A split-night study was performed on 01-22-11 showed evidence of CPAP and was central apneas and the patient was asked to return to use a BiPAP with backup rate or an adaptive serval ventilation device. At the time in beverages doesn't 13 the patient endorsed the Epworth sleepiness score at 17/23 points and the backs inventory at 16 points his BMI was 30.2 his neck circumference 17.5 inches. The patient responded best to adapt SV is an EEP- Expiratory pressure of 10 cm,  and pressure support of 4 cm minimum and 15 cm maximum. He did have problems is continued periodic limb movements at over 60 per hour and associated arousals at about 22 per hour. Sept 2014 download from his recent machine but showed residual AHI of 2.6,  thereby complete resolution of his apnea, he is using a minimum pressure support of 4 cm water maximum pressure support of 10 cm water and EEP at 10 cm.  Epworth 15 points, FSS 48,  GDS 2 .   Interval history for 08/29/2015, [name] is here today for his ASV compliance visit. He has been using his machine and automatic servo ventilation with an expiratory pressure support of 10 cm water and a minimum pressure support of 4 cm . Maximum of 10 cm water pressure and a residual AHI of 7.3,but he does have high air leaks. The residual AHI consists of hypopneas.   His  compliance is 100% with  8 hours and 23 minutes on average nightly use of nasal pillow interface.. His Epworth sleepiness score is endorsed at 12 points , his geriatric depression score is endorsed at 2 out of 15 points.  He reports having word finding difficulties and at times what he calls " garbled speech" . He would be placed words with a sequence of sounds that makes sense to him but not to the person listening to him, it is not a paraphasic error and it is not using a word that sounds alike. He noted delayed word finding and " pulling a blank ", has not gotten lost while driving. He   feels at times a lack of focus.  I added today a Montral cognitive assessment the patient did excellent on visual spatial and executive function, recalled 3 of 5 words, and was able to generate 17 words. 27/30 points.  02-27-2016  [name] reports that he retreats for a nap when his level of fatigue becomes bothersome to him, he can work at his own pace,  He endorsed the geriatric depression score at 6 points, voicing that this merely reflect frustration. He does not appear depressed he endorses again a high degree of fatigue at 54 points, and a high degree of excessive daytime sleepiness at 16 points, which I would expect given that he was age L a positive for narcolepsy. Today's Montral cognitive assessment 22-30 , a  lower point count than in the past- he reports he is sleepy , always in the early afternoon.  Compliance with CPAP is 100% the patient uses an a SV expiratory pressure is 10 cm water minimum pressure support for maximum pressure support 10 cm , residual AHI is 6.9 mainly determined by high air leaks. He does feel comfortable using CPAP take a nap without it. No reason to change any of the settings as he feels comfortable even with the airleak which undoubtedly causes some of these hypopneas to resurge.    Montreal Cognitive Assessment  07/14/2018 02/26/2017 02/27/2016 08/29/2015  Visuospatial/  Executive (0/5) 5 3 2 5  Naming (0/3) 3 3 3 3  Attention: Read list of digits (0/2) 2 2 2 1  Attention: Read list of letters (0/1) 1 1 1 1  Attention: Serial 7 subtraction starting at 100 (0/3) 1 3 3 3  Language: Repeat phrase (0/2) 1 1 1 2  Language : Fluency (0/1) 1 1 1 1  Abstraction (0/2) 2 2 2 2  Delayed Recall (0/5) 3 0 2 3  Orientation (0/6) 6 5 5 6  Total 25 21 22 27  Adjusted Score (based on education) - - 22 27    Montreal Cognitive Assessment  07/14/2018 02/26/2017 02/27/2016 08/29/2015  Visuospatial/ Executive (0/5) 5 3 2 5  Naming (0/3) 3 3 3 3  Attention: Read list of digits (0/2) 2 2 2 1  Attention: Read list of letters (0/1) 1 1 1 1  Attention: Serial 7 subtraction starting at 100 (0/3) 1 3 3 3  Language: Repeat phrase (0/2) 1 1 1 2  Language : Fluency (0/1) 1 1 1 1  Abstraction (0/2) 2 2 2 2  Delayed Recall (0/5) 3 0 2 3  Orientation (0/6) 6 5 5 6  Total 25 21 22 27  Adjusted Score (based on education) - - 22 27     Social History   Socioeconomic History  . Marital status: Married    Spouse name: [name]  . Number of children: 1  . Years of education: 22  . 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 Smoking status: Never Smoker  . Smokeless tobacco: Never Used  Substance and Sexual Activity  . Alcohol use: No  . Drug use: No  . Sexual activity: Not on file  Other Topics Concern  . Not on file  Social History Narrative   Patient is married ([name]) and lives at home with his wife.   Patient has one adult child.   Patient is working part-time and is retired.   Patient has a Bachelors degree.   Patient   is right-handed.   Patient drinks four cups of coffee daily.   Social Determinants of Health   Financial Resource Strain: Not on file  Food Insecurity: Not on file  Transportation Needs: Not on file  Physical Activity: Not on file  Stress: Not on file  Social Connections: Not on file   Intimate Partner Violence: Not on file    Family History  Problem Relation Age of Onset  . Arthritis Sister   . Sleep apnea Brother   . Diabetes Other   . Heart failure Mother   . Diabetes Maternal Uncle     Past Medical History:  Diagnosis Date  . Anxiety   . Arthritis   . Diabetes mellitus without complication ([place])   . Head injury 09/16/12   12 stitches  . Hypertension   . Migraine   . 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 OSA (obstructive sleep apnea)   . OSA treated with BiPAP   . Sleep apnea with use of continuous positive airway pressure (CPAP)    sleep study revealed centrals, adapt SV EPAP at 10 cm water 02-19-11     Past Surgical History:  Procedure Laterality Date  . COLON SURGERY  2010   collapsed colon  . TRIGGER FINGER RELEASE     x4    Current Outpatient Medications  Medication Sig Dispense Refill  . amLODipine (NORVASC) 5 MG tablet Take 5 mg by mouth daily.    . butalbital-aspirin-caffeine (FIORINAL) 50-325-40 MG capsule TAKE 1 CAPSULE BY MOUTH EVERY 4 HOURS AS NEEDED FOR SEVERE MIGRAINE    . CINNAMON PO Take 2 tablets by mouth daily.    . citalopram (CELEXA) 20 MG tablet Take 20 mg by mouth daily.    . cloNIDine (CATAPRES) 0.1 MG tablet Take 0.1 mg by mouth daily as needed (for high BP).    [name] diclofenac sodium (VOLTAREN) 1 % GEL APPLY 2 GRAMS EVERY 6 HOURS AS NEEDED FOR SHOULDER PAIN    . fluocinonide cream (LIDEX) 0.05 %   2  . HUMALOG 100 UNIT/ML injection USE 90 UNITS DAY VIA INSULIN PUMP SUBCUTANEOUS    . hydrochlorothiazide (MICROZIDE) 12.5 MG capsule Take 25 mg by mouth daily.     [name] ibuprofen (ADVIL,MOTRIN) 200 MG tablet Take 200-400 mg by mouth every 6 (six) hours as needed for pain.    . Insulin Human (INSULIN PUMP) 100 unit/ml SOLN Inject 70 each into the skin continuous. Humalog insulin    . lisinopril (ZESTRIL) 10 MG tablet Take 10 mg by mouth daily.    [name] LISINOPRIL  PO Take by mouth.    . Multiple Vitamin (MULTIVITAMIN WITH MINERALS) TABS tablet Take 1 tablet by mouth daily.    [name] omeprazole (PRILOSEC) 40 MG capsule Take by mouth.    Vladimir Faster Glycol-Propyl Glycol (SYSTANE) 0.4-0.3 % SOLN Apply 2 drops to eye 3 (three) times daily.    . vitamin C (ASCORBIC ACID) 500 MG tablet Take 500 mg by mouth daily.     No current facility-administered medications for this visit.    Allergies as of 01/17/2020 - Review Complete 07/14/2018  Allergen Reaction Noted  . Codeine Nausea And Vomiting 10/27/2011  . Dilaudid [hydromorphone hcl] Anaphylaxis 10/27/2011  . Gluten meal Other (See Comments) 09/16/2012  . Morphine and related Anaphylaxis 10/27/2011  . Toradol [ketorolac tromethamine] Nausea And Vomiting 10/27/2011  . Penicillins Other (See Comments) 10/27/2011    Vitals: There were no  vitals taken for this visit. Last Weight:  Wt Readings from Last 1 Encounters:  10/21/18 248 lb (112.5 kg)   Last Height:   Ht Readings from Last 1 Encounters:  10/21/18 6' 3" (1.905 m)  Epworth score was endorsed 13/ 24 points, FSS at 30/63 points.   Physical exam:  General: The patient is awake, alert and appears not in acute distress. The patient is well groomed. Head: Normocephalic, No fall, no faint, no LOC.  Neck is supple. Mallampati 3 , neck circumference: 15. 5"  Neurologic exam :The patient is awake and alert, oriented to place and time.   Memory subjective described as impaired ,  short term memory primarily- he forgets apointments and parts of conversation,  struggles for words, but he has not felt a progression over the last 12 month. . He feels today is a good day, reflected in today's MOCA result.   Montreal Cognitive Assessment  07/14/2018 02/26/2017 02/27/2016 08/29/2015  Visuospatial/ Executive (0/5) _0 Naming (0/3) _1 Attention: Read list of digits (0/2) _2 Attention: Read list of letters (0/1) _3 Attention: Serial 7  subtraction starting at 100 (0/3) _4 Language: Repeat phrase (0/2) _5 Language : Fluency (0/1) _6 Abstraction (0/2) _7 Delayed Recall (0/5) 3 0 2 3  Orientation (0/6) _8 Total _9 Adjusted Score (based on education) - - 22 27    Cranial nerves:Pupils are equally slowly reactive to light. This is long standing.   He underwent a laser surgery for retinopathy and an injury .  His vision has improved for acuity, no defiiciency in color perception.Extraocular movements  in vertical and horizontal planes intact and without nystagmus. Visual fields by finger perimetry are intact. Hearing to finger rub intact.  Facial motor strength is symmetric and tongue and uvula move midline. Motor exam:   Normal tone and normal muscle bulk and symmetric, attenuated -  He has shoulder movement limitations.  ROM in elbow, wrist and fingers are normal. Normal strength in all extremities. 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  Unable to feel vibration and filament touch below the ankles bilaterally, skin is shiny , dystrophic over both feet.  Coordination: Rapid alternating movements in the fingers/hands is normal.  Finger-to-nose maneuver : noted dysmetria , not tremor. Gait and station: Patient walks without assistive device .  Deep tendon reflexes: in the upper and lower extremities are attenuated, symmetric and intact.  Babinski maneuver response is equivocal .   Assessment: Epworth score was endorsed 13/ 24 points, FSS at 30/63 points.   1) patient with complex apnea and HLA positive for narcolepsy - he is still in doubt about the narcolepsy .  Manages with scheduled naps. Works part time. Takes daytime naps. His caretaker function has depleted his energy- he accepted a trial of MODAFINIL 100 mg.   2)  2021 MOCA was 22/30 - concerning for early demntia, and he scored today 25/ 90- which is only mild  cognitive impairment.  He is longtime insulin dependent - over 35 years .diabetic and at high risk for cerebral  microvascular disease.   3) diabetic neuropathy, retinopathy, and vasculopathy,  in adult onset DM 1. Minimal retinopathy, but peripheral neuropathy. Restless legs have developed. RLS is painful, interruting  sleep, he can't share the same bed with his wife.    Plan:  Treatment plan and additional workup : Complex sleep apnea- and excessive daytime sleepiness. patient uses ASV, and has positive HLA test.  continue ASV-PAP use with bella swift ear loops. stimulant medication offered, he struggles with HTN and he is now willing to try Modafinil.  Repeat MOCA , if needed every 6 month, certainly once a year.   Next visit with NP . ASV follow up in 12 month and afterwards yearly alternating with Np and with MOCA.   Consider MRI brain q 3 years - he has an insulin pump (!)    , MD   01-07-2020.         

## 2020-01-17 NOTE — Telephone Encounter (Signed)
Pt called and stated that he was reading about the medication that was given to him today and it states that pt's with Kidney disease should not take Modafinil. Pt states he does have a Kidney disease and is wanting to discuss with RN or Provider. Please advise.

## 2020-01-17 NOTE — Patient Instructions (Signed)
Modafinil tablets What is this medicine? MODAFINIL (moe DAF i nil) is used to treat excessive sleepiness caused by certain sleep disorders. This includes narcolepsy, sleep apnea, and shift work sleep disorder. This medicine may be used for other purposes; ask your health care provider or pharmacist if you have questions. COMMON BRAND NAME(S): Provigil What should I tell my health care provider before I take this medicine? They need to know if you have any of these conditions:  history of depression, mania, or other mental disorder  kidney disease  liver disease  an unusual or allergic reaction to modafinil, other medicines, foods, dyes, or preservatives  pregnant or trying to get pregnant  breast-feeding How should I use this medicine? Take this medicine by mouth with a glass of water. Follow the directions on the prescription label. Take your doses at regular intervals. Do not take your medicine more often than directed. Do not stop taking except on your doctor's advice. A special MedGuide will be given to you by the pharmacist with each prescription and refill. Be sure to read this information carefully each time. Talk to your pediatrician regarding the use of this medicine in children. This medicine is not approved for use in children. Overdosage: If you think you have taken too much of this medicine contact a poison control center or emergency room at once. NOTE: This medicine is only for you. Do not share this medicine with others. What if I miss a dose? If you miss a dose, take it as soon as you can. If it is almost time for your next dose, take only that dose. Do not take double or extra doses. What may interact with this medicine? Do not take this medicine with any of the following medications:  amphetamine or dextroamphetamine  dexmethylphenidate or methylphenidate  medicines called MAO Inhibitors like Nardil, Parnate, Marplan, Eldepryl  pemoline  procarbazine This  medicine may also interact with the following medications:  antifungal medicines like itraconazole or ketoconazole  barbiturates like phenobarbital  birth control pills or other hormone-containing birth control devices or implants  carbamazepine  cyclosporine  diazepam  medicines for depression, anxiety, or psychotic disturbances  phenytoin  propranolol  triazolam  warfarin This list may not describe all possible interactions. Give your health care provider a list of all the medicines, herbs, non-prescription drugs, or dietary supplements you use. Also tell them if you smoke, drink alcohol, or use illegal drugs. Some items may interact with your medicine. What should I watch for while using this medicine? Visit your doctor or health care provider for regular checks on your progress. The full effects of this medicine may not be seen right away. This medicine may cause serious skin reactions. They can happen weeks to months after starting the medicine. Contact your health care provider right away if you notice fevers or flu-like symptoms with a rash. The rash may be red or purple and then turn into blisters or peeling of the skin. Or, you might notice a red rash with swelling of the face, lips or lymph nodes in your neck or under your arms. This medicine may affect your concentration, function, or may hide signs that you are tired. You may get dizzy. Do not drive, use machinery, or do anything that needs mental alertness until you know how this drug affects you. Alcohol can make you more dizzy and may interfere with your response to this medicine or your alertness. Avoid alcoholic drinks. Birth control pills may not work properly while   you are taking this medicine. Talk to your doctor about using an extra method of birth control. It is unknown if the effects of this medicine will be increased by the use of caffeine. Caffeine is available in many foods, beverages, and medications. Ask your  doctor if you should limit or change your intake of caffeine-containing products while on this medicine. What side effects may I notice from receiving this medicine? Side effects that you should report to your doctor or health care professional as soon as possible:  allergic reactions like skin rash, itching or hives, swelling of the face, lips, or tongue  anxiety  breathing problems  chest pain  fast, irregular heartbeat  hallucinations  increased blood pressure  rash, fever, and swollen lymph nodes  redness, blistering, peeling or loosening of the skin, including inside the mouth  sore throat, fever, or chills  suicidal thoughts or other mood changes  tremors  vomiting Side effects that usually do not require medical attention (report to your doctor or health care professional if they continue or are bothersome):  headache  nausea, diarrhea, or stomach upset  nervousness  trouble sleeping This list may not describe all possible side effects. Call your doctor for medical advice about side effects. You may report side effects to FDA at 1-800-FDA-1088. Where should I keep my medicine? Keep out of the reach of children. This medicine can be abused. Keep your medicine in a safe place to protect it from theft. Do not share this medicine with anyone. Selling or giving away this medicine is dangerous and against the law. This medicine may cause accidental overdose and death if taken by other adults, children, or pets. Mix any unused medicine with a substance like cat litter or coffee grounds. Then throw the medicine away in a sealed container like a sealed bag or a coffee can with a lid. Do not use the medicine after the expiration date. Store at room temperature between 20 and 25 degrees C (68 and 77 degrees F). NOTE: This sheet is a summary. It may not cover all possible information. If you have questions about this medicine, talk to your doctor, pharmacist, or health care  provider.  2021 Elsevier/Gold Standard (2018-03-31 10:08:08)

## 2020-01-18 NOTE — Telephone Encounter (Signed)
  I started Mr [name] on a 50% dose of 100 mg Modafinil once a day, and he has neither advanced hepatic nor severe renal disease by our records- I am contacting Dr Clelia Croft to share recent metabolic blood test results with me.   The modafinil dose should be reduced in the elderly and in patients with hepatic disease. Caution is needed in patients with severe renal insufficiency because of substantial increases in levels of modafinil acid.   Most recent labs will be faxed to me now. CD

## 2020-01-18 NOTE — Addendum Note (Signed)
Addended by: Geronimo Running A on: 01/18/2020 01:39 PM   Modules accepted: Orders

## 2020-01-18 NOTE — Telephone Encounter (Signed)
Last creatinine 01-18-2020 was 2.0 , GDR 33.4, BUN was 31 - Dehydrated/ dry according to the high BUN.   There is significant renal impairment and he is still safe with using 100 mg modafinil.  I can discuss this further with him after hours.  CD

## 2020-01-18 NOTE — Telephone Encounter (Signed)
I called patient.  I discussed Dr. Oliva Bustard recommendations regarding the modafinil and his renal impairment.  Patient has read the side effects and is not interested in taking modafinil at this time. Patient is also not interested in starting any new medication for his fatigue at this time.  Patient is very appreciative of Dr. Oliva Bustard assistance.

## 2020-02-01 DIAGNOSIS — M65321 Trigger finger, right index finger: Secondary | ICD-10-CM | POA: Diagnosis not present

## 2020-02-01 DIAGNOSIS — M65352 Trigger finger, left little finger: Secondary | ICD-10-CM | POA: Diagnosis not present

## 2020-02-10 DIAGNOSIS — E109 Type 1 diabetes mellitus without complications: Secondary | ICD-10-CM | POA: Diagnosis not present

## 2020-02-16 DIAGNOSIS — E1022 Type 1 diabetes mellitus with diabetic chronic kidney disease: Secondary | ICD-10-CM | POA: Diagnosis not present

## 2020-02-16 DIAGNOSIS — E1021 Type 1 diabetes mellitus with diabetic nephropathy: Secondary | ICD-10-CM | POA: Diagnosis not present

## 2020-02-16 DIAGNOSIS — Z794 Long term (current) use of insulin: Secondary | ICD-10-CM | POA: Diagnosis not present

## 2020-02-16 DIAGNOSIS — E119 Type 2 diabetes mellitus without complications: Secondary | ICD-10-CM | POA: Diagnosis not present

## 2020-02-20 DIAGNOSIS — H348311 Tributary (branch) retinal vein occlusion, right eye, with retinal neovascularization: Secondary | ICD-10-CM | POA: Diagnosis not present

## 2020-02-21 DIAGNOSIS — N1832 Chronic kidney disease, stage 3b: Secondary | ICD-10-CM | POA: Diagnosis not present

## 2020-02-21 DIAGNOSIS — E1022 Type 1 diabetes mellitus with diabetic chronic kidney disease: Secondary | ICD-10-CM | POA: Diagnosis not present

## 2020-02-21 DIAGNOSIS — Z794 Long term (current) use of insulin: Secondary | ICD-10-CM | POA: Diagnosis not present

## 2020-02-21 DIAGNOSIS — E1042 Type 1 diabetes mellitus with diabetic polyneuropathy: Secondary | ICD-10-CM | POA: Diagnosis not present

## 2020-02-29 DIAGNOSIS — M65352 Trigger finger, left little finger: Secondary | ICD-10-CM | POA: Diagnosis not present

## 2020-02-29 DIAGNOSIS — M65321 Trigger finger, right index finger: Secondary | ICD-10-CM | POA: Diagnosis not present

## 2020-03-05 DIAGNOSIS — H34831 Tributary (branch) retinal vein occlusion, right eye, with macular edema: Secondary | ICD-10-CM | POA: Diagnosis not present

## 2020-03-09 DIAGNOSIS — E109 Type 1 diabetes mellitus without complications: Secondary | ICD-10-CM | POA: Diagnosis not present

## 2020-03-21 DIAGNOSIS — H34831 Tributary (branch) retinal vein occlusion, right eye, with macular edema: Secondary | ICD-10-CM | POA: Diagnosis not present

## 2020-04-02 DIAGNOSIS — R809 Proteinuria, unspecified: Secondary | ICD-10-CM | POA: Diagnosis not present

## 2020-04-02 DIAGNOSIS — N1832 Chronic kidney disease, stage 3b: Secondary | ICD-10-CM | POA: Diagnosis not present

## 2020-04-02 DIAGNOSIS — I129 Hypertensive chronic kidney disease with stage 1 through stage 4 chronic kidney disease, or unspecified chronic kidney disease: Secondary | ICD-10-CM | POA: Diagnosis not present

## 2020-04-02 DIAGNOSIS — D631 Anemia in chronic kidney disease: Secondary | ICD-10-CM | POA: Diagnosis not present

## 2020-04-12 DIAGNOSIS — E109 Type 1 diabetes mellitus without complications: Secondary | ICD-10-CM | POA: Diagnosis not present

## 2020-04-25 DIAGNOSIS — H34831 Tributary (branch) retinal vein occlusion, right eye, with macular edema: Secondary | ICD-10-CM | POA: Diagnosis not present

## 2020-05-15 DIAGNOSIS — E1021 Type 1 diabetes mellitus with diabetic nephropathy: Secondary | ICD-10-CM | POA: Diagnosis not present

## 2020-05-15 DIAGNOSIS — Z794 Long term (current) use of insulin: Secondary | ICD-10-CM | POA: Diagnosis not present

## 2020-05-15 DIAGNOSIS — E119 Type 2 diabetes mellitus without complications: Secondary | ICD-10-CM | POA: Diagnosis not present

## 2020-05-15 DIAGNOSIS — E1022 Type 1 diabetes mellitus with diabetic chronic kidney disease: Secondary | ICD-10-CM | POA: Diagnosis not present

## 2020-05-15 DIAGNOSIS — G4731 Primary central sleep apnea: Secondary | ICD-10-CM | POA: Diagnosis not present

## 2020-05-21 DIAGNOSIS — E1042 Type 1 diabetes mellitus with diabetic polyneuropathy: Secondary | ICD-10-CM | POA: Diagnosis not present

## 2020-05-21 DIAGNOSIS — Z794 Long term (current) use of insulin: Secondary | ICD-10-CM | POA: Diagnosis not present

## 2020-05-21 DIAGNOSIS — E1022 Type 1 diabetes mellitus with diabetic chronic kidney disease: Secondary | ICD-10-CM | POA: Diagnosis not present

## 2020-05-21 DIAGNOSIS — N1832 Chronic kidney disease, stage 3b: Secondary | ICD-10-CM | POA: Diagnosis not present

## 2020-05-30 DIAGNOSIS — H34831 Tributary (branch) retinal vein occlusion, right eye, with macular edema: Secondary | ICD-10-CM | POA: Diagnosis not present

## 2020-05-31 DIAGNOSIS — E1022 Type 1 diabetes mellitus with diabetic chronic kidney disease: Secondary | ICD-10-CM | POA: Diagnosis not present

## 2020-06-13 DIAGNOSIS — M25511 Pain in right shoulder: Secondary | ICD-10-CM | POA: Diagnosis not present

## 2020-06-13 DIAGNOSIS — M25512 Pain in left shoulder: Secondary | ICD-10-CM | POA: Diagnosis not present

## 2020-06-25 DIAGNOSIS — Z794 Long term (current) use of insulin: Secondary | ICD-10-CM | POA: Diagnosis not present

## 2020-06-25 DIAGNOSIS — E109 Type 1 diabetes mellitus without complications: Secondary | ICD-10-CM | POA: Diagnosis not present

## 2020-07-01 DIAGNOSIS — E1022 Type 1 diabetes mellitus with diabetic chronic kidney disease: Secondary | ICD-10-CM | POA: Diagnosis not present

## 2020-07-04 DIAGNOSIS — J029 Acute pharyngitis, unspecified: Secondary | ICD-10-CM | POA: Diagnosis not present

## 2020-07-04 DIAGNOSIS — E1029 Type 1 diabetes mellitus with other diabetic kidney complication: Secondary | ICD-10-CM | POA: Diagnosis not present

## 2020-07-04 DIAGNOSIS — N1832 Chronic kidney disease, stage 3b: Secondary | ICD-10-CM | POA: Diagnosis not present

## 2020-07-04 DIAGNOSIS — I129 Hypertensive chronic kidney disease with stage 1 through stage 4 chronic kidney disease, or unspecified chronic kidney disease: Secondary | ICD-10-CM | POA: Diagnosis not present

## 2020-07-10 DIAGNOSIS — H34831 Tributary (branch) retinal vein occlusion, right eye, with macular edema: Secondary | ICD-10-CM | POA: Diagnosis not present

## 2020-07-11 DIAGNOSIS — M79642 Pain in left hand: Secondary | ICD-10-CM | POA: Diagnosis not present

## 2020-07-11 DIAGNOSIS — M79641 Pain in right hand: Secondary | ICD-10-CM | POA: Diagnosis not present

## 2020-07-11 DIAGNOSIS — M65321 Trigger finger, right index finger: Secondary | ICD-10-CM | POA: Diagnosis not present

## 2020-07-11 DIAGNOSIS — M65352 Trigger finger, left little finger: Secondary | ICD-10-CM | POA: Diagnosis not present

## 2020-07-12 DIAGNOSIS — E1065 Type 1 diabetes mellitus with hyperglycemia: Secondary | ICD-10-CM | POA: Diagnosis not present

## 2020-07-18 ENCOUNTER — Other Ambulatory Visit: Payer: Self-pay

## 2020-07-18 ENCOUNTER — Encounter: Payer: Self-pay | Admitting: Adult Health

## 2020-07-18 ENCOUNTER — Ambulatory Visit: Payer: Medicare Other | Admitting: Adult Health

## 2020-07-18 VITALS — BP 141/77 | HR 86 | Ht 75.0 in | Wt 232.0 lb

## 2020-07-18 DIAGNOSIS — G4733 Obstructive sleep apnea (adult) (pediatric): Secondary | ICD-10-CM

## 2020-07-18 NOTE — Progress Notes (Signed)
PATIENT: [name] DOB: Mar 15, 1950  REASON FOR VISIT: follow up HISTORY FROM: patient PRIMARY NEUROLOGIST: Dr. Brett Fairy  HISTORY OF PRESENT ILLNESS: Today 07/18/20:  [name] is a 70 year old male with a history of obstructive sleep apnea on CPAP.  He returns today for follow-up.  He reports that the BiPAP continues to work fairly well for him.  He does note that his mask leaks but he is not able to get it any tighter.  He does not wish to try to a different style of mask.  He denies any new issues.  He returns today for an evaluation.     REVIEW OF SYSTEMS: Out of a complete 14 system review of symptoms, the patient complains only of the following symptoms, and all other reviewed systems are negative.  FSS ESS  ALLERGIES: Allergies  Allergen Reactions   Codeine Nausea And Vomiting    Last time, came to ED   Dilaudid [Hydromorphone Hcl] Anaphylaxis    Patient goes into cardiac arrest   Gluten Meal Other (See Comments)    Severe migraines   Morphine And Related Anaphylaxis   Other Anaphylaxis   Toradol [Ketorolac Tromethamine] Nausea And Vomiting   Penicillins Other (See Comments)    Unknown     HOME MEDICATIONS: Outpatient Medications Prior to Visit  Medication Sig Dispense Refill   amLODipine (NORVASC) 5 MG tablet Take 5 mg by mouth daily.     butalbital-aspirin-caffeine (FIORINAL) 50-325-40 MG capsule TAKE 1 CAPSULE BY MOUTH EVERY 4 HOURS AS NEEDED FOR SEVERE MIGRAINE     CINNAMON PO Take 2 tablets by mouth daily.     citalopram (CELEXA) 20 MG tablet Take 20 mg by mouth daily.     cloNIDine (CATAPRES) 0.1 MG tablet Take 0.1 mg by mouth daily as needed (for high BP).     diclofenac sodium (VOLTAREN) 1 % GEL APPLY 2 GRAMS EVERY 6 HOURS AS NEEDED FOR SHOULDER PAIN     fluocinonide cream (LIDEX) 0.05 %   2   HUMALOG 100 UNIT/ML injection USE 90 UNITS DAY VIA INSULIN PUMP SUBCUTANEOUS     hydrochlorothiazide (MICROZIDE) 12.5 MG capsule Take 25 mg by mouth  daily.      ibuprofen (ADVIL,MOTRIN) 200 MG tablet Take 200-400 mg by mouth every 6 (six) hours as needed for pain.     Insulin Human (INSULIN PUMP) 100 unit/ml SOLN Inject 70 each into the skin continuous. Humalog insulin     lisinopril (ZESTRIL) 10 MG tablet Take 10 mg by mouth daily.     LISINOPRIL PO Take by mouth.     Multiple Vitamin (MULTIVITAMIN WITH MINERALS) TABS tablet Take 1 tablet by mouth daily.     olmesartan (BENICAR) 20 MG tablet Take 20 mg by mouth daily.     omeprazole (PRILOSEC) 40 MG capsule Take by mouth.     Polyethyl Glycol-Propyl Glycol 0.4-0.3 % SOLN Apply 2 drops to eye 3 (three) times daily.     vitamin C (ASCORBIC ACID) 500 MG tablet Take 500 mg by mouth daily.     No facility-administered medications prior to visit.    PAST MEDICAL HISTORY: Past Medical History:  Diagnosis Date   Anxiety    Arthritis    Diabetes mellitus without complication ([place])    Head injury 09/16/12   12 stitches   Hypertension    Migraine    Narcolepsy without cataplexy with hypocretin deficiency 11/23/2013    HLA testing positive for narcolepsy    OSA (obstructive  sleep apnea)    OSA treated with BiPAP    Sleep apnea with use of continuous positive airway pressure (CPAP)    sleep study revealed centrals, adapt SV EPAP at 10 cm water 02-19-11     PAST SURGICAL HISTORY: Past Surgical History:  Procedure Laterality Date   COLON SURGERY  2010   collapsed colon   TRIGGER FINGER RELEASE     x4    FAMILY HISTORY: Family History  Problem Relation Age of Onset   Arthritis Sister    Sleep apnea Brother    Diabetes Other    Heart failure Mother    Diabetes Maternal Uncle     SOCIAL HISTORY: Social History   Socioeconomic History   Marital status: Married    Spouse name: [name]   Number of children: 1   Years of education: 22   Highest education level: Not on file  Occupational History    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   Patient has one adult child.   Patient is working part-time and is retired.   Patient has a Haematologist.   Patient is right-handed.   Patient drinks four cups of coffee daily.   Social Determinants of Health   Financial Resource Strain: Not on file  Food Insecurity: Not on file  Transportation Needs: Not on file  Physical Activity: Not on file  Stress: Not on file  Social Connections: Not on file  Intimate Partner Violence: Not on file      PHYSICAL EXAM  Vitals:   07/18/20 1459  BP: (!) 141/77  Pulse: 86  Weight: 232 lb (105.2 kg)  Height: 6' 3"  (1.905 m)   Body mass index is 29 kg/m.  Generalized: Well developed, in no acute distress  Chest: Lungs clear to auscultation bilaterally  Neurological examination  Mentation: Alert oriented to time, place, history taking. Follows all commands speech and language fluent Cranial nerve II-XII: Extraocular movements were full, visual field were full on confrontational test Head turning and shoulder shrug  were normal and symmetric. Motor: The motor testing reveals 5 over 5 strength of all 4 extremities. Good symmetric motor tone is noted throughout.  Sensory: Sensory testing is intact to soft touch on all 4 extremities. No evidence of extinction is noted.  Gait and station: Gait is normal.    DIAGNOSTIC DATA (LABS, IMAGING, TESTING) - I reviewed patient records, labs, notes, testing and imaging myself where available.  Lab Results  Component Value Date   WBC 9.7 10/27/2011   HGB 12.8 (L) 10/27/2011   HCT 36.6 (L) 10/27/2011   MCV 83.2 10/27/2011   PLT 204 10/27/2011      Component Value Date/Time   NA 139 10/27/2011 2340   K 4.6 10/27/2011 2340   CL 100 10/27/2011  2340   CO2 27 10/27/2011 2340   GLUCOSE 206 (H) 10/27/2011 2340   BUN 29 (H) 10/27/2011 2340   CREATININE 1.80 (H) 10/27/2011 2340   CALCIUM 9.6 10/27/2011 2340   PROT 6.8 01/30/2007 0430   ALBUMIN 3.7 01/30/2007 0430   AST 25 01/30/2007 0430   ALT 32 01/30/2007 0430   ALKPHOS  114 01/30/2007 0430   BILITOT 0.8 01/30/2007 0430   GFRNONAA 39 (L) 10/27/2011 2340   GFRAA 45 (L) 10/27/2011 2340       ASSESSMENT AND PLAN 70 y.o. 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 here with:  OSA on BiPAP  - CPAP compliance excellent -Residual AHI slightly elevated - High leak was likely contributing to increased AHI - Encourage patient to use BiPAP nightly and > 4 hours each night - F/U in 1 year or sooner if needed    Ward Givens, MSN, NP-C 07/18/2020, 3:23 PM [facility] [address], [place], [place] [address] [phone]

## 2020-07-23 DIAGNOSIS — N1832 Chronic kidney disease, stage 3b: Secondary | ICD-10-CM | POA: Diagnosis not present

## 2020-07-23 DIAGNOSIS — I129 Hypertensive chronic kidney disease with stage 1 through stage 4 chronic kidney disease, or unspecified chronic kidney disease: Secondary | ICD-10-CM | POA: Diagnosis not present

## 2020-07-23 DIAGNOSIS — D631 Anemia in chronic kidney disease: Secondary | ICD-10-CM | POA: Diagnosis not present

## 2020-07-23 DIAGNOSIS — R809 Proteinuria, unspecified: Secondary | ICD-10-CM | POA: Diagnosis not present

## 2020-07-23 DIAGNOSIS — N2581 Secondary hyperparathyroidism of renal origin: Secondary | ICD-10-CM | POA: Diagnosis not present

## 2020-07-23 DIAGNOSIS — N189 Chronic kidney disease, unspecified: Secondary | ICD-10-CM | POA: Diagnosis not present

## 2020-07-26 DIAGNOSIS — M25511 Pain in right shoulder: Secondary | ICD-10-CM | POA: Diagnosis not present

## 2020-08-01 DIAGNOSIS — E1022 Type 1 diabetes mellitus with diabetic chronic kidney disease: Secondary | ICD-10-CM | POA: Diagnosis not present

## 2020-08-02 DIAGNOSIS — M75101 Unspecified rotator cuff tear or rupture of right shoulder, not specified as traumatic: Secondary | ICD-10-CM | POA: Diagnosis not present

## 2020-08-21 DIAGNOSIS — Z794 Long term (current) use of insulin: Secondary | ICD-10-CM | POA: Diagnosis not present

## 2020-08-21 DIAGNOSIS — E1042 Type 1 diabetes mellitus with diabetic polyneuropathy: Secondary | ICD-10-CM | POA: Diagnosis not present

## 2020-08-21 DIAGNOSIS — H34831 Tributary (branch) retinal vein occlusion, right eye, with macular edema: Secondary | ICD-10-CM | POA: Diagnosis not present

## 2020-08-21 DIAGNOSIS — E1022 Type 1 diabetes mellitus with diabetic chronic kidney disease: Secondary | ICD-10-CM | POA: Diagnosis not present

## 2020-08-21 DIAGNOSIS — N1832 Chronic kidney disease, stage 3b: Secondary | ICD-10-CM | POA: Diagnosis not present

## 2020-09-03 DIAGNOSIS — E1022 Type 1 diabetes mellitus with diabetic chronic kidney disease: Secondary | ICD-10-CM | POA: Diagnosis not present

## 2020-09-12 DIAGNOSIS — M9901 Segmental and somatic dysfunction of cervical region: Secondary | ICD-10-CM | POA: Diagnosis not present

## 2020-09-12 DIAGNOSIS — M9903 Segmental and somatic dysfunction of lumbar region: Secondary | ICD-10-CM | POA: Diagnosis not present

## 2020-09-12 DIAGNOSIS — M5030 Other cervical disc degeneration, unspecified cervical region: Secondary | ICD-10-CM | POA: Diagnosis not present

## 2020-09-12 DIAGNOSIS — M5136 Other intervertebral disc degeneration, lumbar region: Secondary | ICD-10-CM | POA: Diagnosis not present

## 2020-10-03 DIAGNOSIS — E1022 Type 1 diabetes mellitus with diabetic chronic kidney disease: Secondary | ICD-10-CM | POA: Diagnosis not present

## 2020-10-08 DIAGNOSIS — H34831 Tributary (branch) retinal vein occlusion, right eye, with macular edema: Secondary | ICD-10-CM | POA: Diagnosis not present

## 2020-10-31 DIAGNOSIS — E1065 Type 1 diabetes mellitus with hyperglycemia: Secondary | ICD-10-CM | POA: Diagnosis not present

## 2020-11-02 DIAGNOSIS — E1022 Type 1 diabetes mellitus with diabetic chronic kidney disease: Secondary | ICD-10-CM | POA: Diagnosis not present

## 2020-11-19 DIAGNOSIS — H34831 Tributary (branch) retinal vein occlusion, right eye, with macular edema: Secondary | ICD-10-CM | POA: Diagnosis not present

## 2020-11-23 DIAGNOSIS — N1832 Chronic kidney disease, stage 3b: Secondary | ICD-10-CM | POA: Diagnosis not present

## 2020-11-23 DIAGNOSIS — E1042 Type 1 diabetes mellitus with diabetic polyneuropathy: Secondary | ICD-10-CM | POA: Diagnosis not present

## 2020-11-23 DIAGNOSIS — E1022 Type 1 diabetes mellitus with diabetic chronic kidney disease: Secondary | ICD-10-CM | POA: Diagnosis not present

## 2020-11-23 DIAGNOSIS — Z794 Long term (current) use of insulin: Secondary | ICD-10-CM | POA: Diagnosis not present

## 2020-12-03 DIAGNOSIS — Z85828 Personal history of other malignant neoplasm of skin: Secondary | ICD-10-CM | POA: Diagnosis not present

## 2020-12-03 DIAGNOSIS — L309 Dermatitis, unspecified: Secondary | ICD-10-CM | POA: Diagnosis not present

## 2020-12-03 DIAGNOSIS — L814 Other melanin hyperpigmentation: Secondary | ICD-10-CM | POA: Diagnosis not present

## 2020-12-03 DIAGNOSIS — L570 Actinic keratosis: Secondary | ICD-10-CM | POA: Diagnosis not present

## 2020-12-03 DIAGNOSIS — E1022 Type 1 diabetes mellitus with diabetic chronic kidney disease: Secondary | ICD-10-CM | POA: Diagnosis not present

## 2020-12-07 DIAGNOSIS — I129 Hypertensive chronic kidney disease with stage 1 through stage 4 chronic kidney disease, or unspecified chronic kidney disease: Secondary | ICD-10-CM | POA: Diagnosis not present

## 2020-12-07 DIAGNOSIS — J069 Acute upper respiratory infection, unspecified: Secondary | ICD-10-CM | POA: Diagnosis not present

## 2020-12-07 DIAGNOSIS — N1832 Chronic kidney disease, stage 3b: Secondary | ICD-10-CM | POA: Diagnosis not present

## 2020-12-07 DIAGNOSIS — E1029 Type 1 diabetes mellitus with other diabetic kidney complication: Secondary | ICD-10-CM | POA: Diagnosis not present

## 2021-01-10 DIAGNOSIS — E1040 Type 1 diabetes mellitus with diabetic neuropathy, unspecified: Secondary | ICD-10-CM | POA: Diagnosis not present

## 2021-01-10 DIAGNOSIS — I10 Essential (primary) hypertension: Secondary | ICD-10-CM | POA: Diagnosis not present

## 2021-01-10 DIAGNOSIS — Z125 Encounter for screening for malignant neoplasm of prostate: Secondary | ICD-10-CM | POA: Diagnosis not present

## 2021-01-10 DIAGNOSIS — E785 Hyperlipidemia, unspecified: Secondary | ICD-10-CM | POA: Diagnosis not present

## 2021-01-11 DIAGNOSIS — E1065 Type 1 diabetes mellitus with hyperglycemia: Secondary | ICD-10-CM | POA: Diagnosis not present

## 2021-01-14 DIAGNOSIS — H34831 Tributary (branch) retinal vein occlusion, right eye, with macular edema: Secondary | ICD-10-CM | POA: Diagnosis not present

## 2021-01-15 DIAGNOSIS — N1832 Chronic kidney disease, stage 3b: Secondary | ICD-10-CM | POA: Diagnosis not present

## 2021-01-17 DIAGNOSIS — I10 Essential (primary) hypertension: Secondary | ICD-10-CM | POA: Diagnosis not present

## 2021-01-17 DIAGNOSIS — E1029 Type 1 diabetes mellitus with other diabetic kidney complication: Secondary | ICD-10-CM | POA: Diagnosis not present

## 2021-01-17 DIAGNOSIS — E1039 Type 1 diabetes mellitus with other diabetic ophthalmic complication: Secondary | ICD-10-CM | POA: Diagnosis not present

## 2021-01-17 DIAGNOSIS — E1040 Type 1 diabetes mellitus with diabetic neuropathy, unspecified: Secondary | ICD-10-CM | POA: Diagnosis not present

## 2021-01-17 DIAGNOSIS — R82998 Other abnormal findings in urine: Secondary | ICD-10-CM | POA: Diagnosis not present

## 2021-01-17 DIAGNOSIS — Z0000 Encounter for general adult medical examination without abnormal findings: Secondary | ICD-10-CM | POA: Diagnosis not present

## 2021-01-19 DIAGNOSIS — E1022 Type 1 diabetes mellitus with diabetic chronic kidney disease: Secondary | ICD-10-CM | POA: Diagnosis not present

## 2021-01-23 DIAGNOSIS — N1832 Chronic kidney disease, stage 3b: Secondary | ICD-10-CM | POA: Diagnosis not present

## 2021-01-23 DIAGNOSIS — D631 Anemia in chronic kidney disease: Secondary | ICD-10-CM | POA: Diagnosis not present

## 2021-01-23 DIAGNOSIS — E1022 Type 1 diabetes mellitus with diabetic chronic kidney disease: Secondary | ICD-10-CM | POA: Diagnosis not present

## 2021-01-23 DIAGNOSIS — I129 Hypertensive chronic kidney disease with stage 1 through stage 4 chronic kidney disease, or unspecified chronic kidney disease: Secondary | ICD-10-CM | POA: Diagnosis not present

## 2021-01-31 DIAGNOSIS — M75101 Unspecified rotator cuff tear or rupture of right shoulder, not specified as traumatic: Secondary | ICD-10-CM | POA: Diagnosis not present

## 2021-01-31 DIAGNOSIS — M7541 Impingement syndrome of right shoulder: Secondary | ICD-10-CM | POA: Diagnosis not present

## 2021-02-06 DIAGNOSIS — Z4789 Encounter for other orthopedic aftercare: Secondary | ICD-10-CM | POA: Diagnosis not present

## 2021-02-06 DIAGNOSIS — M65321 Trigger finger, right index finger: Secondary | ICD-10-CM | POA: Diagnosis not present

## 2021-02-12 DIAGNOSIS — E1042 Type 1 diabetes mellitus with diabetic polyneuropathy: Secondary | ICD-10-CM | POA: Diagnosis not present

## 2021-02-12 DIAGNOSIS — Z794 Long term (current) use of insulin: Secondary | ICD-10-CM | POA: Diagnosis not present

## 2021-02-12 DIAGNOSIS — N1832 Chronic kidney disease, stage 3b: Secondary | ICD-10-CM | POA: Diagnosis not present

## 2021-02-12 DIAGNOSIS — E1022 Type 1 diabetes mellitus with diabetic chronic kidney disease: Secondary | ICD-10-CM | POA: Diagnosis not present

## 2021-02-18 DIAGNOSIS — G4731 Primary central sleep apnea: Secondary | ICD-10-CM | POA: Diagnosis not present

## 2021-02-20 DIAGNOSIS — E1022 Type 1 diabetes mellitus with diabetic chronic kidney disease: Secondary | ICD-10-CM | POA: Diagnosis not present

## 2021-02-20 DIAGNOSIS — M25641 Stiffness of right hand, not elsewhere classified: Secondary | ICD-10-CM | POA: Diagnosis not present

## 2021-02-20 IMAGING — US US RENAL
1 series · 14 of 25 positions shown · non-contrast
Comparison: None.

CLINICAL DATA: Initial evaluation for stage III A chronic kidney
disease.

EXAM:
RENAL / URINARY TRACT ULTRASOUND COMPLETE

[Series 1: us renal · 0.26mm/px · 14 of 41 slices shown]
[im 1/41]
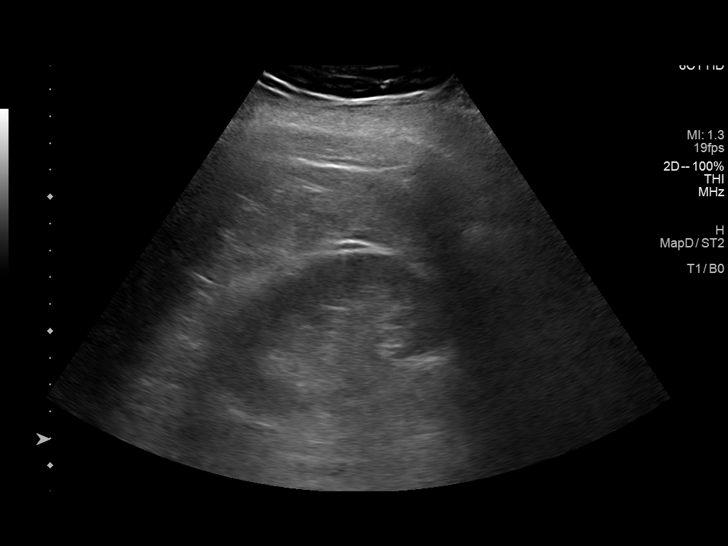
[im 4/41]
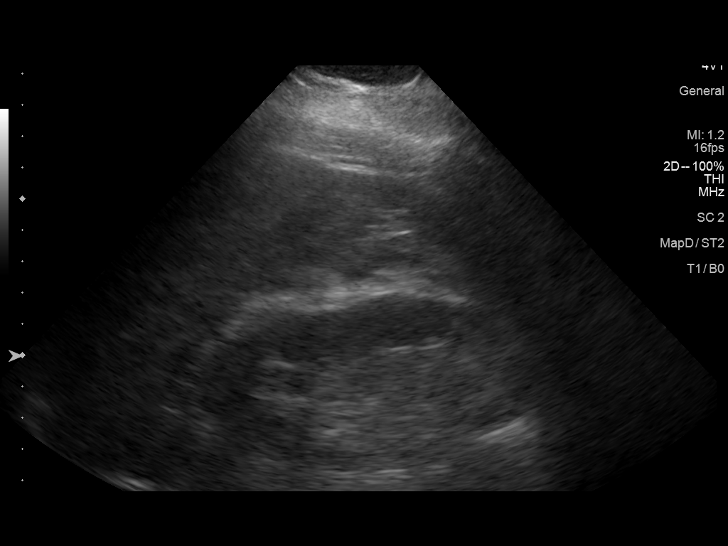
[im 7/41]
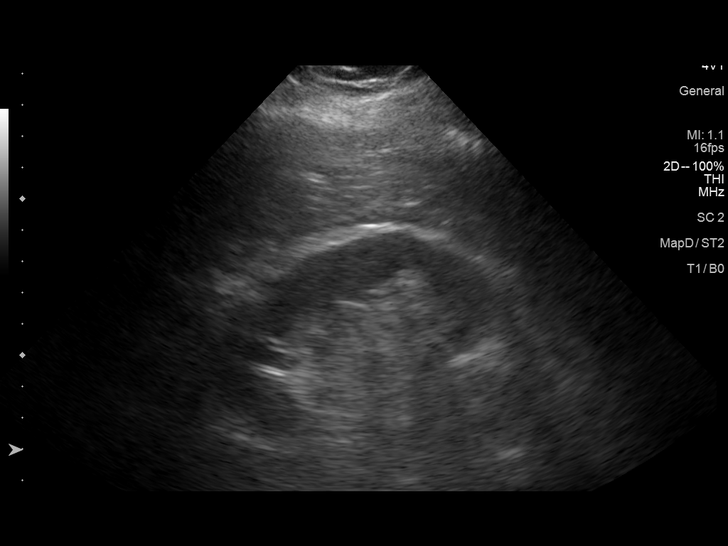
[im 11/41]
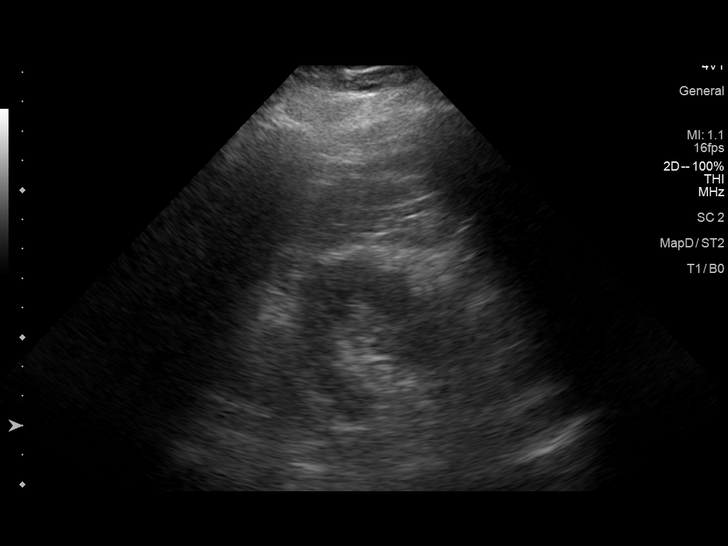
[im 14/41]
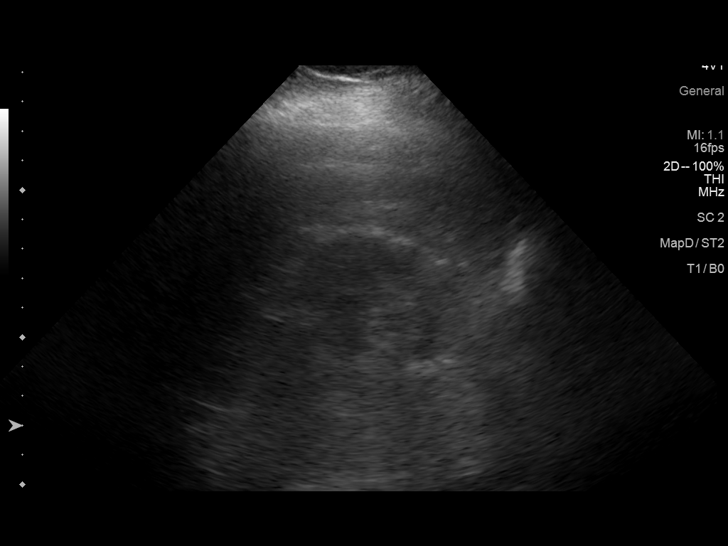
[im 16/41]
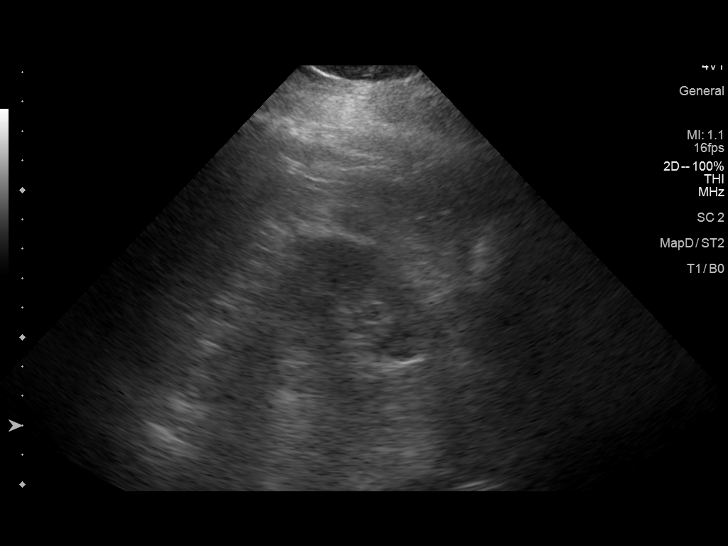
[im 19/41]
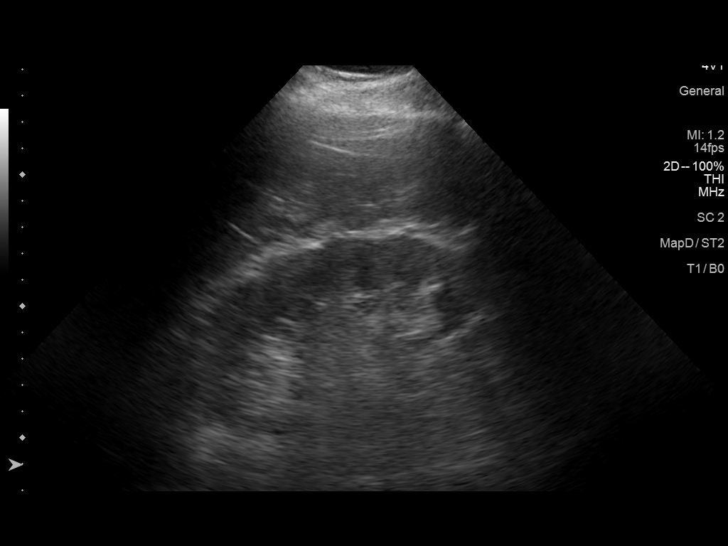
[im 22/41]
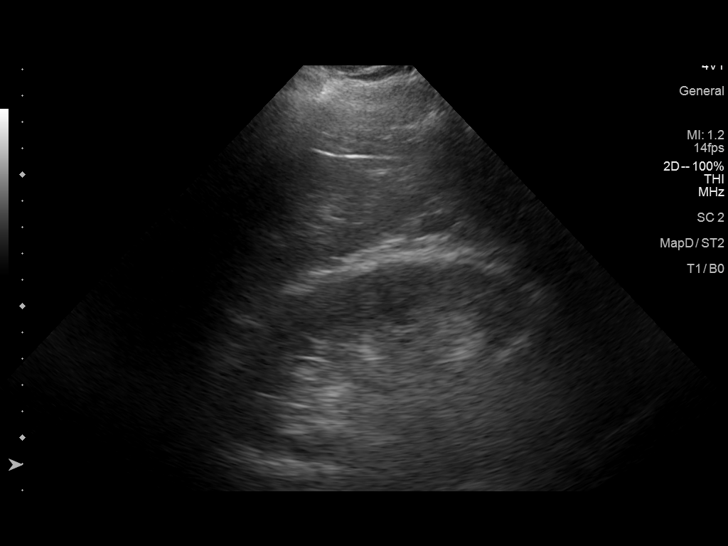
[im 26/41]
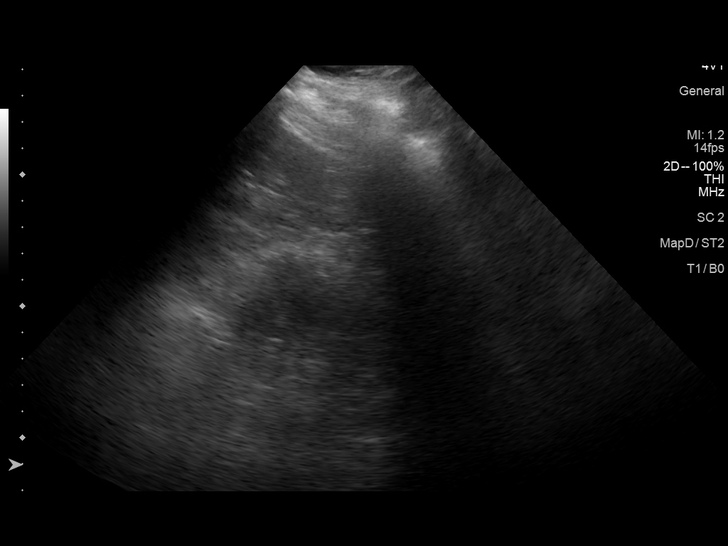
[im 27/41]
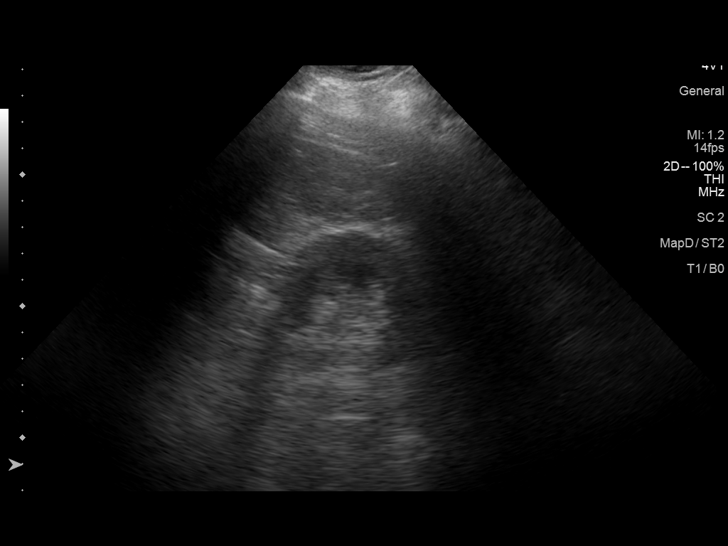
[im 31/41]
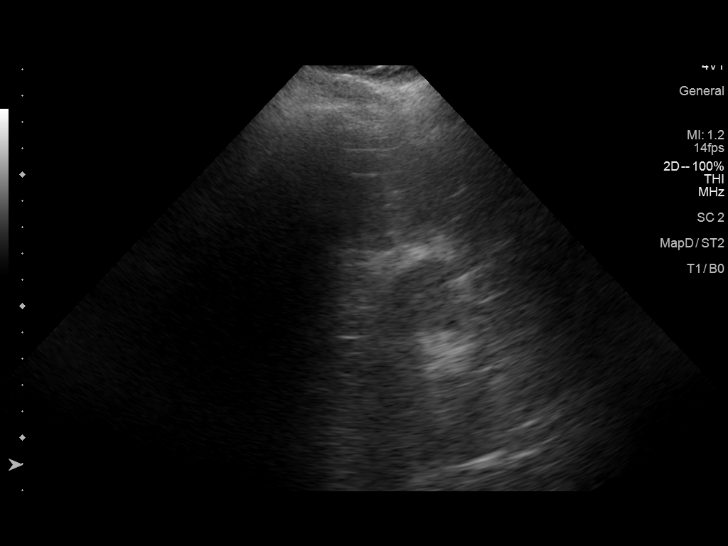
[im 34/41]
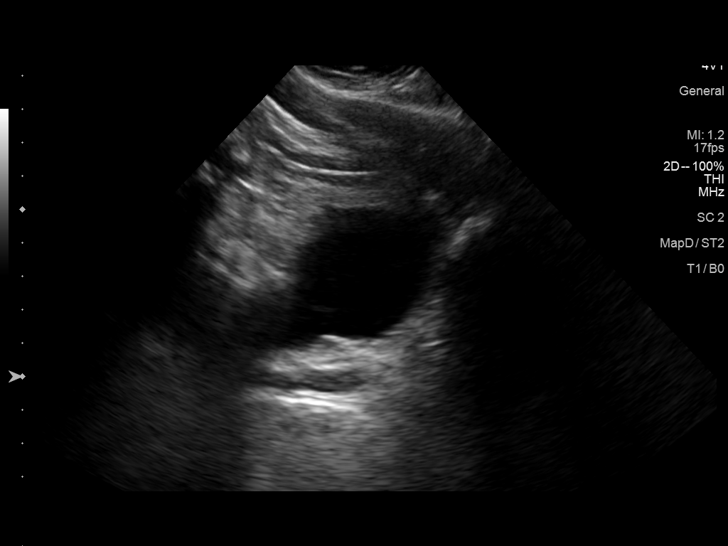
[im 37/41]
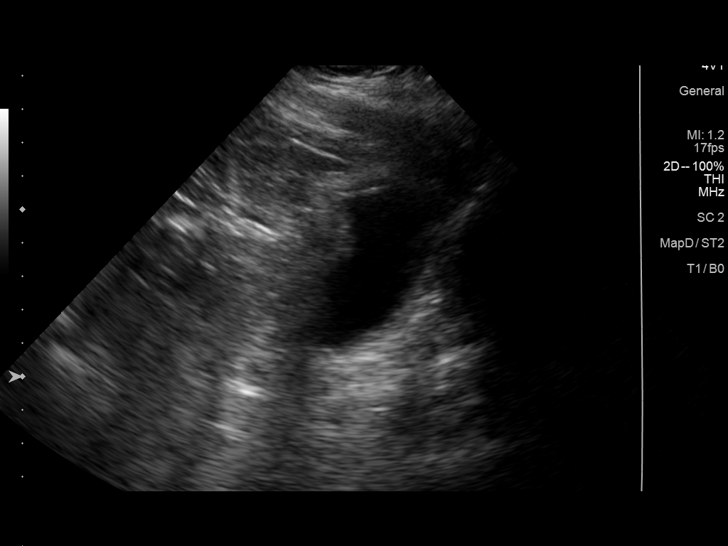
[im 41/41]
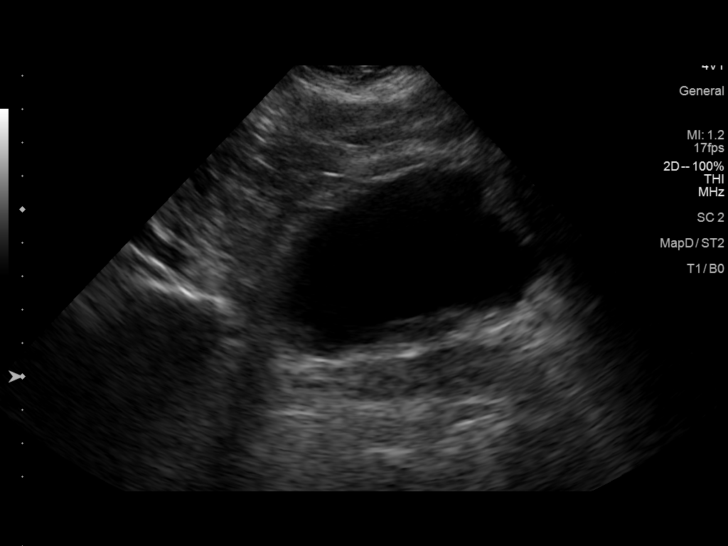

[14 of 25 positions shown; findings below may reference images not displayed]

FINDINGS: Right Kidney:

Renal measurements: 9.4 x 5.4 x 4.8 cm = volume: 128 mL. Mildly
increased echogenicity seen within the renal parenchyma. No
nephrolithiasis or hydronephrosis. No focal renal mass.

Left Kidney:

Renal measurements: 11.9 x 6.6 x 4.9 cm = volume: 201.3 mL. Mildly
increased echogenicity seen within the renal parenchyma. No
nephrolithiasis or hydronephrosis. No focal renal mass.

Bladder:

Appears normal for degree of bladder distention.

Other:

None.
IMPRESSION: 1. Mildly increased echogenicity within the renal parenchyma,
compatible with medical renal disease.
2. No hydronephrosis or other significant finding.

## 2021-02-28 DIAGNOSIS — H34831 Tributary (branch) retinal vein occlusion, right eye, with macular edema: Secondary | ICD-10-CM | POA: Diagnosis not present

## 2021-03-18 DIAGNOSIS — G4731 Primary central sleep apnea: Secondary | ICD-10-CM | POA: Diagnosis not present

## 2021-03-21 DIAGNOSIS — E1022 Type 1 diabetes mellitus with diabetic chronic kidney disease: Secondary | ICD-10-CM | POA: Diagnosis not present

## 2021-04-08 DIAGNOSIS — E1065 Type 1 diabetes mellitus with hyperglycemia: Secondary | ICD-10-CM | POA: Diagnosis not present

## 2021-04-18 DIAGNOSIS — G4731 Primary central sleep apnea: Secondary | ICD-10-CM | POA: Diagnosis not present

## 2021-04-20 DIAGNOSIS — E1022 Type 1 diabetes mellitus with diabetic chronic kidney disease: Secondary | ICD-10-CM | POA: Diagnosis not present

## 2021-04-22 DIAGNOSIS — H34831 Tributary (branch) retinal vein occlusion, right eye, with macular edema: Secondary | ICD-10-CM | POA: Diagnosis not present

## 2021-05-08 DIAGNOSIS — H25813 Combined forms of age-related cataract, bilateral: Secondary | ICD-10-CM | POA: Diagnosis not present

## 2021-05-08 DIAGNOSIS — E113512 Type 2 diabetes mellitus with proliferative diabetic retinopathy with macular edema, left eye: Secondary | ICD-10-CM | POA: Diagnosis not present

## 2021-05-08 DIAGNOSIS — H3582 Retinal ischemia: Secondary | ICD-10-CM | POA: Diagnosis not present

## 2021-05-08 DIAGNOSIS — H34831 Tributary (branch) retinal vein occlusion, right eye, with macular edema: Secondary | ICD-10-CM | POA: Diagnosis not present

## 2021-05-13 DIAGNOSIS — Z794 Long term (current) use of insulin: Secondary | ICD-10-CM | POA: Diagnosis not present

## 2021-05-13 DIAGNOSIS — E1022 Type 1 diabetes mellitus with diabetic chronic kidney disease: Secondary | ICD-10-CM | POA: Diagnosis not present

## 2021-05-13 DIAGNOSIS — N1832 Chronic kidney disease, stage 3b: Secondary | ICD-10-CM | POA: Diagnosis not present

## 2021-05-13 DIAGNOSIS — E1042 Type 1 diabetes mellitus with diabetic polyneuropathy: Secondary | ICD-10-CM | POA: Diagnosis not present

## 2021-05-20 DIAGNOSIS — E103593 Type 1 diabetes mellitus with proliferative diabetic retinopathy without macular edema, bilateral: Secondary | ICD-10-CM | POA: Diagnosis not present

## 2021-05-20 DIAGNOSIS — H2512 Age-related nuclear cataract, left eye: Secondary | ICD-10-CM | POA: Diagnosis not present

## 2021-05-20 DIAGNOSIS — H25811 Combined forms of age-related cataract, right eye: Secondary | ICD-10-CM | POA: Diagnosis not present

## 2021-05-26 DIAGNOSIS — E109 Type 1 diabetes mellitus without complications: Secondary | ICD-10-CM | POA: Diagnosis not present

## 2021-06-06 DIAGNOSIS — H2511 Age-related nuclear cataract, right eye: Secondary | ICD-10-CM | POA: Diagnosis not present

## 2021-06-06 DIAGNOSIS — H269 Unspecified cataract: Secondary | ICD-10-CM | POA: Diagnosis not present

## 2021-06-06 DIAGNOSIS — G4730 Sleep apnea, unspecified: Secondary | ICD-10-CM | POA: Diagnosis not present

## 2021-06-18 DIAGNOSIS — R5383 Other fatigue: Secondary | ICD-10-CM | POA: Diagnosis not present

## 2021-06-18 DIAGNOSIS — E1029 Type 1 diabetes mellitus with other diabetic kidney complication: Secondary | ICD-10-CM | POA: Diagnosis not present

## 2021-06-18 DIAGNOSIS — I10 Essential (primary) hypertension: Secondary | ICD-10-CM | POA: Diagnosis not present

## 2021-06-18 DIAGNOSIS — J069 Acute upper respiratory infection, unspecified: Secondary | ICD-10-CM | POA: Diagnosis not present

## 2021-07-01 DIAGNOSIS — E1065 Type 1 diabetes mellitus with hyperglycemia: Secondary | ICD-10-CM | POA: Diagnosis not present

## 2021-07-02 DIAGNOSIS — I10 Essential (primary) hypertension: Secondary | ICD-10-CM | POA: Diagnosis not present

## 2021-07-02 DIAGNOSIS — E1029 Type 1 diabetes mellitus with other diabetic kidney complication: Secondary | ICD-10-CM | POA: Diagnosis not present

## 2021-07-02 DIAGNOSIS — R051 Acute cough: Secondary | ICD-10-CM | POA: Diagnosis not present

## 2021-07-02 DIAGNOSIS — J069 Acute upper respiratory infection, unspecified: Secondary | ICD-10-CM | POA: Diagnosis not present

## 2021-07-04 DIAGNOSIS — H34831 Tributary (branch) retinal vein occlusion, right eye, with macular edema: Secondary | ICD-10-CM | POA: Diagnosis not present

## 2021-07-17 NOTE — Progress Notes (Signed)
PATIENT: Oletta Cohn DOB: [birth year]  REASON FOR VISIT: follow up HISTORY FROM: patient PRIMARY NEUROLOGIST: Dr. Brett Fairy  HISTORY OF PRESENT ILLNESS: Today 07/18/21:  Mr. Raver is a 71 year old male with a history of OSA On ASV. He returns today for follow-up. Reports machine is working well for him. Not happy with the billing department at [facility]. Thinking about switching to another DME    07/18/20: Mr. Hannan is a 71 year old male with a history of obstructive sleep apnea on CPAP.  He returns today for follow-up.  He reports that the BiPAP continues to work fairly well for him.  He does note that his mask leaks but he is not able to get it any tighter.  He does not wish to try to a different style of mask.  He denies any new issues.  He returns today for an evaluation.     REVIEW OF SYSTEMS: Out of a complete 14 system review of symptoms, the patient complains only of the following symptoms, and all other reviewed systems are negative.  ESS 14   ALLERGIES: Allergies  Allergen Reactions   Codeine Nausea And Vomiting    Last time, came to ED   Dilaudid [Hydromorphone Hcl] Anaphylaxis    Patient goes into cardiac arrest   Gluten Meal Other (See Comments)    Severe migraines   Morphine And Related Anaphylaxis   Other Anaphylaxis   Toradol [Ketorolac Tromethamine] Nausea And Vomiting   Penicillins Other (See Comments)    Unknown     HOME MEDICATIONS: Outpatient Medications Prior to Visit  Medication Sig Dispense Refill   amLODipine (NORVASC) 5 MG tablet Take 5 mg by mouth daily.     butalbital-aspirin-caffeine (FIORINAL) 50-325-40 MG capsule TAKE 1 CAPSULE BY MOUTH EVERY 4 HOURS AS NEEDED FOR SEVERE MIGRAINE     CINNAMON PO Take 2 tablets by mouth daily.     citalopram (CELEXA) 20 MG tablet Take 20 mg by mouth daily.     cloNIDine (CATAPRES) 0.1 MG tablet Take 0.1 mg by mouth daily as needed (for high BP).     diclofenac sodium (VOLTAREN) 1 % GEL APPLY 2 GRAMS  EVERY 6 HOURS AS NEEDED FOR SHOULDER PAIN     fluocinonide cream (LIDEX) 0.05 %   2   HUMALOG 100 UNIT/ML injection USE 90 UNITS DAY VIA INSULIN PUMP SUBCUTANEOUS     hydrochlorothiazide (MICROZIDE) 12.5 MG capsule Take 25 mg by mouth daily.      ibuprofen (ADVIL,MOTRIN) 200 MG tablet Take 200-400 mg by mouth every 6 (six) hours as needed for pain.     Insulin Human (INSULIN PUMP) 100 unit/ml SOLN Inject 70 each into the skin continuous. Humalog insulin     lisinopril (ZESTRIL) 10 MG tablet Take 10 mg by mouth daily.     LISINOPRIL PO Take by mouth.     Multiple Vitamin (MULTIVITAMIN WITH MINERALS) TABS tablet Take 1 tablet by mouth daily.     olmesartan (BENICAR) 20 MG tablet Take 20 mg by mouth daily.     omeprazole (PRILOSEC) 40 MG capsule Take by mouth.     Polyethyl Glycol-Propyl Glycol 0.4-0.3 % SOLN Apply 2 drops to eye 3 (three) times daily.     vitamin C (ASCORBIC ACID) 500 MG tablet Take 500 mg by mouth daily.     No facility-administered medications prior to visit.    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 11/23/2013    HLA testing positive for narcolepsy    OSA (obstructive sleep apnea)    OSA treated with BiPAP    Sleep apnea with use of continuous positive airway pressure (CPAP)    sleep study revealed centrals, adapt SV EPAP at 10 cm water 02-19-11     PAST SURGICAL HISTORY: Past Surgical History:  Procedure Laterality Date   COLON SURGERY  2010   collapsed colon   TRIGGER FINGER RELEASE     x4    FAMILY HISTORY: Family History  Problem Relation Age of Onset   Arthritis Sister    Sleep apnea Brother    Diabetes Other    Heart failure Mother    Diabetes Maternal Uncle     SOCIAL HISTORY: Social History   Socioeconomic History    Marital status: Married    Spouse name: [name]   Number of children: 1   Years of education: 22   Highest education level: Not on file  Occupational History    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   Patient has one adult child.   Patient is working part-time and is retired.   Patient has a Haematologist.   Patient is right-handed.   Patient drinks four cups of coffee daily.   Social Determinants of Health   Financial Resource Strain: Not on file  Food Insecurity: Not on file  Transportation Needs: Not on file  Physical Activity: Not on file  Stress: Not on file  Social Connections: Not on file  Intimate Partner Violence: Not on file      PHYSICAL EXAM  Vitals:   07/18/21 1511  BP: (!) 162/83  Pulse: 84  Weight: 234 lb (106.1 kg)  Height: '6\' 3"'  (1.905 m)    Body mass index is 29.25 kg/m.  Generalized: Well developed, in no acute distress  Chest: Lungs clear to auscultation bilaterally  Neurological examination  Mentation: Alert oriented to time, place, history taking. Follows all commands speech and language fluent Cranial nerve II-XII: Extraocular movements were full, visual field were full on confrontational test Head turning and shoulder shrug  were normal and symmetric. Gait and station: Gait is normal.    DIAGNOSTIC DATA (LABS, IMAGING, TESTING) - I reviewed patient records, labs, notes, testing and imaging myself where available.  Lab Results  Component Value Date   WBC 9.7 10/27/2011   HGB 12.8 (L) 10/27/2011   HCT 36.6 (L) 10/27/2011   MCV 83.2 10/27/2011   PLT 204 10/27/2011      Component Value Date/Time   NA 139 10/27/2011 2340   K 4.6 10/27/2011 2340   CL 100  10/27/2011 2340   CO2 27 10/27/2011 2340   GLUCOSE 206 (H) 10/27/2011 2340   BUN 29 (H) 10/27/2011 2340   CREATININE 1.80 (H) 10/27/2011 2340   CALCIUM 9.6 10/27/2011 2340   PROT 6.8 01/30/2007 0430   ALBUMIN 3.7 01/30/2007 0430   AST 25 01/30/2007 0430   ALT  32 01/30/2007 0430   ALKPHOS 114 01/30/2007 0430   BILITOT 0.8 01/30/2007 0430   GFRNONAA 39 (L) 10/27/2011 2340   GFRAA 45 (L) 10/27/2011 2340       ASSESSMENT AND PLAN 71 y.o. 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 here with:  OSA on ASV  - CPAP compliance excellent -Residual AHI slightly elevated - Discussed pressure settings with Sleep lab will increase max pressure to 12 - Encourage patient to use ASV nightly and > 4 hours each night - will look at another DL in 1 month - F/U in 1 year or sooner if needed    Ward Givens, MSN, NP-C 07/18/2021, 3:08 PM [facility] [address] [place], [place] [address] [phone]

## 2021-07-18 ENCOUNTER — Ambulatory Visit: Payer: Medicare Other | Admitting: Adult Health

## 2021-07-18 ENCOUNTER — Telehealth: Payer: Self-pay | Admitting: Adult Health

## 2021-07-18 VITALS — BP 162/83 | HR 84 | Ht 75.0 in | Wt 234.0 lb

## 2021-07-18 DIAGNOSIS — G4731 Primary central sleep apnea: Secondary | ICD-10-CM

## 2021-07-18 DIAGNOSIS — G4733 Obstructive sleep apnea (adult) (pediatric): Secondary | ICD-10-CM

## 2021-07-18 DIAGNOSIS — Z9989 Dependence on other enabling machines and devices: Secondary | ICD-10-CM | POA: Diagnosis not present

## 2021-07-18 NOTE — Telephone Encounter (Signed)
Pt came in after his appt, he spoke with his DME company, states he would like to change DME companies 100%, he is very unhappy with [facility].

## 2021-07-22 ENCOUNTER — Telehealth: Payer: Self-pay | Admitting: *Deleted

## 2021-07-22 NOTE — Telephone Encounter (Addendum)
Placed order for switch to [facility].  I did not have Sleep study to send.  Did fax first NP appt. Orders for also for increase max pressure to 12cm.

## 2021-07-22 NOTE — Telephone Encounter (Signed)
Spoke to pt.  He did not want another sleep test.  So if would need another would stay with [facility] vs going to [facility],

## 2021-07-22 NOTE — Telephone Encounter (Signed)
-----   Message from Butch Penny, NP sent at 07/22/2021  8:46 AM EDT ----- Please call patient and let him know that we are adjusting his pressure and that we will send to another DME. Will look at another DL is 1 month

## 2021-07-22 NOTE — Addendum Note (Signed)
Addended by: Hermenia Fiscal S on: 07/22/2021 10:03 AM   Modules accepted: Orders

## 2021-07-22 NOTE — Telephone Encounter (Signed)
Ok to switch to another DME

## 2021-07-22 NOTE — Telephone Encounter (Signed)
Ross Ludwig, RN; [name] got it      Previous Messages    ----- Message -----  From: [name], RN  Sent: 07/18/2021   5:08 PM EDT  To: [name]; [name]   New order in epic,   Change in pressure.  (DONE)   [name]  Male, 71 y.o., [birth year]  MRN:  [id number] sandy RN

## 2021-07-23 ENCOUNTER — Other Ambulatory Visit: Payer: Self-pay | Admitting: Dermatology

## 2021-07-23 ENCOUNTER — Ambulatory Visit
Admission: RE | Admit: 2021-07-23 | Discharge: 2021-07-23 | Disposition: A | Discharge status: Home / Self Care | Payer: Medicare Other | Source: Ambulatory Visit | Attending: Dermatology | Admitting: Dermatology

## 2021-07-23 DIAGNOSIS — L814 Other melanin hyperpigmentation: Secondary | ICD-10-CM | POA: Diagnosis not present

## 2021-07-23 DIAGNOSIS — L821 Other seborrheic keratosis: Secondary | ICD-10-CM | POA: Diagnosis not present

## 2021-07-23 DIAGNOSIS — I82403 Acute embolism and thrombosis of unspecified deep veins of lower extremity, bilateral: Secondary | ICD-10-CM | POA: Diagnosis not present

## 2021-07-23 DIAGNOSIS — Z85828 Personal history of other malignant neoplasm of skin: Secondary | ICD-10-CM | POA: Diagnosis not present

## 2021-07-23 DIAGNOSIS — I82409 Acute embolism and thrombosis of unspecified deep veins of unspecified lower extremity: Secondary | ICD-10-CM

## 2021-07-23 DIAGNOSIS — I8312 Varicose veins of left lower extremity with inflammation: Secondary | ICD-10-CM | POA: Diagnosis not present

## 2021-07-24 DIAGNOSIS — E109 Type 1 diabetes mellitus without complications: Secondary | ICD-10-CM | POA: Diagnosis not present

## 2021-07-24 NOTE — Telephone Encounter (Signed)
Cherylin Mylar, RN; [name] I do not have a copy of his sleep study in our system.   Please let me know when I can proceed with the order.      Previous Messages    ----- Message -----  From: [name], RN  Sent: 07/24/2021   9:19 AM EDT  To: [name]; [name]   Good Morning again,   Need copy of pts sleep study (if you have one) faxed to me at [phone]. I dont see in Epic.  Since your DME do you have on in his file.?   Sandy RN   [name]  Male, 71 y.o., [birth year]  MRN:  [id number]

## 2021-08-01 DIAGNOSIS — N1832 Chronic kidney disease, stage 3b: Secondary | ICD-10-CM | POA: Diagnosis not present

## 2021-08-01 DIAGNOSIS — E1042 Type 1 diabetes mellitus with diabetic polyneuropathy: Secondary | ICD-10-CM | POA: Diagnosis not present

## 2021-08-01 DIAGNOSIS — Z794 Long term (current) use of insulin: Secondary | ICD-10-CM | POA: Diagnosis not present

## 2021-08-01 DIAGNOSIS — E1022 Type 1 diabetes mellitus with diabetic chronic kidney disease: Secondary | ICD-10-CM | POA: Diagnosis not present

## 2021-08-07 DIAGNOSIS — E1022 Type 1 diabetes mellitus with diabetic chronic kidney disease: Secondary | ICD-10-CM | POA: Diagnosis not present

## 2021-08-07 DIAGNOSIS — I129 Hypertensive chronic kidney disease with stage 1 through stage 4 chronic kidney disease, or unspecified chronic kidney disease: Secondary | ICD-10-CM | POA: Diagnosis not present

## 2021-08-07 DIAGNOSIS — D631 Anemia in chronic kidney disease: Secondary | ICD-10-CM | POA: Diagnosis not present

## 2021-08-07 DIAGNOSIS — N1832 Chronic kidney disease, stage 3b: Secondary | ICD-10-CM | POA: Diagnosis not present

## 2021-08-22 DIAGNOSIS — E109 Type 1 diabetes mellitus without complications: Secondary | ICD-10-CM | POA: Diagnosis not present

## 2021-08-28 DIAGNOSIS — H34831 Tributary (branch) retinal vein occlusion, right eye, with macular edema: Secondary | ICD-10-CM | POA: Diagnosis not present

## 2021-09-21 DIAGNOSIS — E109 Type 1 diabetes mellitus without complications: Secondary | ICD-10-CM | POA: Diagnosis not present

## 2021-09-24 DIAGNOSIS — E1065 Type 1 diabetes mellitus with hyperglycemia: Secondary | ICD-10-CM | POA: Diagnosis not present

## 2021-10-16 DIAGNOSIS — M25512 Pain in left shoulder: Secondary | ICD-10-CM | POA: Diagnosis not present

## 2021-10-16 DIAGNOSIS — M7541 Impingement syndrome of right shoulder: Secondary | ICD-10-CM | POA: Diagnosis not present

## 2021-10-16 DIAGNOSIS — M25511 Pain in right shoulder: Secondary | ICD-10-CM | POA: Diagnosis not present

## 2021-10-16 DIAGNOSIS — M75111 Incomplete rotator cuff tear or rupture of right shoulder, not specified as traumatic: Secondary | ICD-10-CM | POA: Diagnosis not present

## 2021-10-23 DIAGNOSIS — H34831 Tributary (branch) retinal vein occlusion, right eye, with macular edema: Secondary | ICD-10-CM | POA: Diagnosis not present

## 2021-10-31 DIAGNOSIS — Z794 Long term (current) use of insulin: Secondary | ICD-10-CM | POA: Diagnosis not present

## 2021-10-31 DIAGNOSIS — E1022 Type 1 diabetes mellitus with diabetic chronic kidney disease: Secondary | ICD-10-CM | POA: Diagnosis not present

## 2021-10-31 DIAGNOSIS — N1832 Chronic kidney disease, stage 3b: Secondary | ICD-10-CM | POA: Diagnosis not present

## 2021-10-31 DIAGNOSIS — E1042 Type 1 diabetes mellitus with diabetic polyneuropathy: Secondary | ICD-10-CM | POA: Diagnosis not present

## 2021-11-01 DIAGNOSIS — R0981 Nasal congestion: Secondary | ICD-10-CM | POA: Diagnosis not present

## 2021-11-01 DIAGNOSIS — U071 COVID-19: Secondary | ICD-10-CM | POA: Diagnosis not present

## 2021-11-01 DIAGNOSIS — R5383 Other fatigue: Secondary | ICD-10-CM | POA: Diagnosis not present

## 2021-11-01 DIAGNOSIS — R059 Cough, unspecified: Secondary | ICD-10-CM | POA: Diagnosis not present

## 2021-12-18 DIAGNOSIS — H34831 Tributary (branch) retinal vein occlusion, right eye, with macular edema: Secondary | ICD-10-CM | POA: Diagnosis not present

## 2021-12-26 DIAGNOSIS — G4731 Primary central sleep apnea: Secondary | ICD-10-CM | POA: Diagnosis not present

## 2022-01-24 DIAGNOSIS — E785 Hyperlipidemia, unspecified: Secondary | ICD-10-CM | POA: Diagnosis not present

## 2022-01-24 DIAGNOSIS — Z125 Encounter for screening for malignant neoplasm of prostate: Secondary | ICD-10-CM | POA: Diagnosis not present

## 2022-01-24 DIAGNOSIS — I10 Essential (primary) hypertension: Secondary | ICD-10-CM | POA: Diagnosis not present

## 2022-01-24 DIAGNOSIS — N1832 Chronic kidney disease, stage 3b: Secondary | ICD-10-CM | POA: Diagnosis not present

## 2022-01-24 DIAGNOSIS — E1029 Type 1 diabetes mellitus with other diabetic kidney complication: Secondary | ICD-10-CM | POA: Diagnosis not present

## 2022-01-26 DIAGNOSIS — G4731 Primary central sleep apnea: Secondary | ICD-10-CM | POA: Diagnosis not present

## 2022-01-27 DIAGNOSIS — N1832 Chronic kidney disease, stage 3b: Secondary | ICD-10-CM | POA: Diagnosis not present

## 2022-01-31 DIAGNOSIS — Z0000 Encounter for general adult medical examination without abnormal findings: Secondary | ICD-10-CM | POA: Diagnosis not present

## 2022-01-31 DIAGNOSIS — Z1331 Encounter for screening for depression: Secondary | ICD-10-CM | POA: Diagnosis not present

## 2022-01-31 DIAGNOSIS — F3341 Major depressive disorder, recurrent, in partial remission: Secondary | ICD-10-CM | POA: Diagnosis not present

## 2022-01-31 DIAGNOSIS — Z1339 Encounter for screening examination for other mental health and behavioral disorders: Secondary | ICD-10-CM | POA: Diagnosis not present

## 2022-01-31 DIAGNOSIS — R82998 Other abnormal findings in urine: Secondary | ICD-10-CM | POA: Diagnosis not present

## 2022-01-31 DIAGNOSIS — E1029 Type 1 diabetes mellitus with other diabetic kidney complication: Secondary | ICD-10-CM | POA: Diagnosis not present

## 2022-02-06 DIAGNOSIS — E1022 Type 1 diabetes mellitus with diabetic chronic kidney disease: Secondary | ICD-10-CM | POA: Diagnosis not present

## 2022-02-06 DIAGNOSIS — N1832 Chronic kidney disease, stage 3b: Secondary | ICD-10-CM | POA: Diagnosis not present

## 2022-02-06 DIAGNOSIS — D631 Anemia in chronic kidney disease: Secondary | ICD-10-CM | POA: Diagnosis not present

## 2022-02-06 DIAGNOSIS — I129 Hypertensive chronic kidney disease with stage 1 through stage 4 chronic kidney disease, or unspecified chronic kidney disease: Secondary | ICD-10-CM | POA: Diagnosis not present

## 2022-02-12 ENCOUNTER — Encounter (HOSPITAL_BASED_OUTPATIENT_CLINIC_OR_DEPARTMENT_OTHER): Payer: Self-pay | Admitting: Emergency Medicine

## 2022-02-12 ENCOUNTER — Other Ambulatory Visit: Payer: Self-pay

## 2022-02-12 ENCOUNTER — Emergency Department (HOSPITAL_BASED_OUTPATIENT_CLINIC_OR_DEPARTMENT_OTHER)
Admission: EM | Admit: 2022-02-12 | Discharge: 2022-02-12 | Disposition: A | Discharge status: Home / Self Care | Payer: Medicare Other | Attending: Emergency Medicine | Admitting: Emergency Medicine

## 2022-02-12 DIAGNOSIS — I10 Essential (primary) hypertension: Secondary | ICD-10-CM | POA: Diagnosis not present

## 2022-02-12 DIAGNOSIS — Z794 Long term (current) use of insulin: Secondary | ICD-10-CM | POA: Diagnosis not present

## 2022-02-12 DIAGNOSIS — R519 Headache, unspecified: Secondary | ICD-10-CM | POA: Diagnosis not present

## 2022-02-12 DIAGNOSIS — E119 Type 2 diabetes mellitus without complications: Secondary | ICD-10-CM | POA: Insufficient documentation

## 2022-02-12 DIAGNOSIS — G43809 Other migraine, not intractable, without status migrainosus: Secondary | ICD-10-CM | POA: Diagnosis not present

## 2022-02-12 DIAGNOSIS — Z79899 Other long term (current) drug therapy: Secondary | ICD-10-CM | POA: Diagnosis not present

## 2022-02-12 DIAGNOSIS — G43811 Other migraine, intractable, with status migrainosus: Secondary | ICD-10-CM | POA: Insufficient documentation

## 2022-02-12 LAB — CBG MONITORING, ED: Glucose-Capillary: 134 mg/dL — ABNORMAL HIGH (ref 70–99)

## 2022-02-12 MED ORDER — METOCLOPRAMIDE HCL 5 MG/ML IJ SOLN
10.0000 mg | Freq: Once | INTRAMUSCULAR | Status: AC
  Administered 2022-02-12: 10 mg via INTRAVENOUS
  Filled 2022-02-12: qty 2

## 2022-02-12 MED ORDER — MAGNESIUM SULFATE 2 GM/50ML IV SOLN
2.0000 g | Freq: Once | INTRAVENOUS | Status: AC
  Administered 2022-02-12: 2 g via INTRAVENOUS
  Filled 2022-02-12: qty 50

## 2022-02-12 MED ORDER — LORAZEPAM 2 MG/ML IJ SOLN
0.5000 mg | Freq: Once | INTRAMUSCULAR | Status: AC
  Administered 2022-02-12: 0.5 mg via INTRAVENOUS
  Filled 2022-02-12: qty 1

## 2022-02-12 MED ORDER — ACETAMINOPHEN 500 MG PO TABS
1000.0000 mg | ORAL_TABLET | Freq: Once | ORAL | Status: AC
  Administered 2022-02-12: 1000 mg via ORAL
  Filled 2022-02-12: qty 2

## 2022-02-12 NOTE — ED Triage Notes (Signed)
Cbg 170 via pts blood sugar monitor,will correlate with ours.

## 2022-02-12 NOTE — ED Notes (Signed)
Pt reports no improvement in migraine.

## 2022-02-12 NOTE — ED Notes (Signed)
Pt stated he is feeling better and is waiting for his discharge papers. Pt has taken his Bp cuff, pulse Ox, and monitor off so that  he can get dressed.

## 2022-02-12 NOTE — ED Provider Notes (Signed)
[facility] Provider Note   CSN: [id number] Arrival date & time: 02/12/22  1753     History  No chief complaint on file.   [name] is a 72 y.o. male.  Patient is a a 72 year old male with a history of hypertension, diabetes, headache, sleep apnea who is presenting today with complaints of a headache.  Patient was here earlier because his wife was here with medical issues and reports that anytime she has stuff going on his headaches just seem to get worse.  He reports they are under a lot of stress right now and not always a seems to trigger the headaches.  He does have a history of headaches and the last time he had a headache like this was 3 or 4 weeks ago.  He reports he just has not been able to go home and he just could not take it anymore.  He denies any vomiting.  He reports that just feels like his whole body is shutting down.  Denies any infectious symptoms.  The history is provided by the patient and medical records.       Home Medications Prior to Admission medications   Medication Sig Start Date End Date Taking? Authorizing Provider  amLODipine (NORVASC) 5 MG tablet Take 5 mg by mouth daily.    [provider]  butalbital-aspirin-caffeine (FIORINAL) 50-325-40 MG capsule TAKE 1 CAPSULE BY MOUTH EVERY 4 HOURS AS NEEDED FOR SEVERE MIGRAINE 03/18/18   [provider]  CINNAMON PO Take 2 tablets by mouth daily.    [provider]  citalopram (CELEXA) 20 MG tablet Take 20 mg by mouth daily.    [provider]  cloNIDine (CATAPRES) 0.1 MG tablet Take 0.1 mg by mouth daily as needed (for high BP).    [provider]  diclofenac sodium (VOLTAREN) 1 % GEL APPLY 2 GRAMS EVERY 6 HOURS AS NEEDED FOR SHOULDER PAIN 01/25/18   [provider]  fluocinonide cream (LIDEX) 0.05 %  02/10/15   [provider]  HUMALOG 100 UNIT/ML injection USE 90 UNITS DAY VIA INSULIN PUMP SUBCUTANEOUS  07/12/18   [provider]  hydrochlorothiazide (MICROZIDE) 12.5 MG capsule Take 25 mg by mouth daily.     [provider]  ibuprofen (ADVIL,MOTRIN) 200 MG tablet Take 200-400 mg by mouth every 6 (six) hours as needed for pain.    [provider]  Insulin Human (INSULIN PUMP) 100 unit/ml SOLN Inject 70 each into the skin continuous. Humalog insulin    [provider]  lisinopril (ZESTRIL) 10 MG tablet Take 10 mg by mouth daily. 05/07/18   [provider]  LISINOPRIL PO Take by mouth.    [provider]  Multiple Vitamin (MULTIVITAMIN WITH MINERALS) TABS tablet Take 1 tablet by mouth daily.    [provider]  olmesartan (BENICAR) 20 MG tablet Take 20 mg by mouth daily. 01/04/20   [provider]  omeprazole (PRILOSEC) 40 MG capsule Take by mouth.    [provider]  Polyethyl Glycol-Propyl Glycol 0.4-0.3 % SOLN Apply 2 drops to eye 3 (three) times daily.    [provider]  vitamin C (ASCORBIC ACID) 500 MG tablet Take 500 mg by mouth daily.    [provider]      Allergies    Codeine, Dilaudid [hydromorphone hcl], Gluten meal, Morphine and related, Other, Toradol [ketorolac tromethamine], and Penicillins    Review of Systems   Review of Systems  Physical Exam Updated Vital Signs BP (!) 164/94   Pulse 78   Temp (!) 97.4 F (36.3 C) (Oral)   Resp 19   SpO2 97%  Physical Exam Vitals and nursing note reviewed.  Constitutional:      General: He is not in acute distress.    Appearance: He is well-developed.  HENT:     Head: Normocephalic and atraumatic.     Right Ear: Tympanic membrane normal.     Left Ear: Tympanic membrane normal.  Eyes:     Conjunctiva/sclera: Conjunctivae normal.     Pupils: Pupils are equal, round, and reactive to light.     Comments: photophobia  Cardiovascular:     Rate and Rhythm: Normal rate and regular rhythm.     Heart sounds: No murmur heard. Pulmonary:      Effort: Pulmonary effort is normal. No respiratory distress.     Breath sounds: Normal breath sounds. No wheezing or rales.  Abdominal:     General: There is no distension.     Palpations: Abdomen is soft.     Tenderness: There is no abdominal tenderness. There is no guarding or rebound.  Musculoskeletal:        General: No tenderness. Normal range of motion.     Cervical back: Normal range of motion and neck supple.  Skin:    General: Skin is warm and dry.     Findings: No erythema or rash.  Neurological:     Mental Status: He is alert and oriented to person, place, and time. Mental status is at baseline.     Sensory: No sensory deficit.     Motor: No weakness.     Coordination: Coordination normal.     Gait: Gait normal.  Psychiatric:        Behavior: Behavior normal.     ED Results / Procedures / Treatments   Labs (all labs ordered are listed, but only abnormal results are displayed) Labs Reviewed  CBG MONITORING, ED - Abnormal; Notable for the following components:      Result Value   Glucose-Capillary 134 (*)    All other components within normal limits    EKG None  Radiology No results found.  Procedures Procedures    Medications Ordered in ED Medications  metoCLOPramide (REGLAN) injection 10 mg (10 mg Intravenous Given 02/12/22 1818)  magnesium sulfate IVPB 2 g 50 mL (0 g Intravenous Stopped 02/12/22 1914)  LORazepam (ATIVAN) injection 0.5 mg (0.5 mg Intravenous Given 02/12/22 1905)  LORazepam (ATIVAN) injection 0.5 mg (0.5 mg Intravenous Given 02/12/22 1953)  acetaminophen (TYLENOL) tablet 1,000 mg (1,000 mg Oral Given 02/12/22 1952)    ED Course/ Medical Decision Making/ A&P                             Medical Decision Making Risk OTC drugs. Prescription drug management.   Pt with typical migraine HA without sx suggestive of SAH(sudden onset, worst of life, or deficits), infection, or cavernous vein thrombosis.  Normal neuro exam and vital signs. Will  give reglan and mag as pt is driving and has multiple allergies.   Will re-eval.  8:41 PM Pt's headache is improved and he would like to go home.         Final Clinical Impression(s) / ED Diagnoses Final diagnoses:  Other migraine with status migrainosus, intractable    Rx / DC Orders ED Discharge Orders     None  Blanchie Dessert, MD 02/12/22 2041

## 2022-02-12 NOTE — ED Triage Notes (Signed)
Migraine, sitting in his wife's room today, pt is type 1 diabetic, on insulin pump . States when he has migraine his blood pressure is usually in the 200's

## 2022-02-19 DIAGNOSIS — E1065 Type 1 diabetes mellitus with hyperglycemia: Secondary | ICD-10-CM | POA: Diagnosis not present

## 2022-02-24 ENCOUNTER — Telehealth: Payer: Self-pay

## 2022-02-24 NOTE — Telephone Encounter (Signed)
     Patient  visit on 02/12/2022  at [facility] was for headache, migraine.  Have you been able to follow up with your primary care physician?  The patient was or was not able to obtain any needed medicine or equipment. No medication prescribed.  Are there diet recommendations that you are having difficulty following? No  Patient expresses understanding of discharge instructions and education provided has no other needs at this time. Yes   [place] Resource Care Guide   [email or web address]  ?? [id number]   Website: [email or web address]  [email or web address]

## 2022-02-26 DIAGNOSIS — G4731 Primary central sleep apnea: Secondary | ICD-10-CM | POA: Diagnosis not present

## 2022-02-26 DIAGNOSIS — H34831 Tributary (branch) retinal vein occlusion, right eye, with macular edema: Secondary | ICD-10-CM | POA: Diagnosis not present

## 2022-03-03 DIAGNOSIS — Z794 Long term (current) use of insulin: Secondary | ICD-10-CM | POA: Diagnosis not present

## 2022-03-03 DIAGNOSIS — E1022 Type 1 diabetes mellitus with diabetic chronic kidney disease: Secondary | ICD-10-CM | POA: Diagnosis not present

## 2022-03-03 DIAGNOSIS — N1832 Chronic kidney disease, stage 3b: Secondary | ICD-10-CM | POA: Diagnosis not present

## 2022-03-03 DIAGNOSIS — E1042 Type 1 diabetes mellitus with diabetic polyneuropathy: Secondary | ICD-10-CM | POA: Diagnosis not present

## 2022-03-27 DIAGNOSIS — G4731 Primary central sleep apnea: Secondary | ICD-10-CM | POA: Diagnosis not present

## 2022-05-06 DIAGNOSIS — L97512 Non-pressure chronic ulcer of other part of right foot with fat layer exposed: Secondary | ICD-10-CM | POA: Diagnosis not present

## 2022-05-06 DIAGNOSIS — M2041 Other hammer toe(s) (acquired), right foot: Secondary | ICD-10-CM | POA: Diagnosis not present

## 2022-05-06 DIAGNOSIS — L080 Pyoderma: Secondary | ICD-10-CM | POA: Diagnosis not present

## 2022-05-06 DIAGNOSIS — M79671 Pain in right foot: Secondary | ICD-10-CM | POA: Diagnosis not present

## 2022-05-06 DIAGNOSIS — B957 Other staphylococcus as the cause of diseases classified elsewhere: Secondary | ICD-10-CM | POA: Diagnosis not present

## 2022-05-06 DIAGNOSIS — L600 Ingrowing nail: Secondary | ICD-10-CM | POA: Diagnosis not present

## 2022-05-12 DIAGNOSIS — H34831 Tributary (branch) retinal vein occlusion, right eye, with macular edema: Secondary | ICD-10-CM | POA: Diagnosis not present

## 2022-05-13 DIAGNOSIS — L97512 Non-pressure chronic ulcer of other part of right foot with fat layer exposed: Secondary | ICD-10-CM | POA: Diagnosis not present

## 2022-05-13 DIAGNOSIS — L600 Ingrowing nail: Secondary | ICD-10-CM | POA: Diagnosis not present

## 2022-05-20 DIAGNOSIS — L97512 Non-pressure chronic ulcer of other part of right foot with fat layer exposed: Secondary | ICD-10-CM | POA: Diagnosis not present

## 2022-05-27 DIAGNOSIS — K080 Exfoliation of teeth due to systemic causes: Secondary | ICD-10-CM | POA: Diagnosis not present

## 2022-05-30 DIAGNOSIS — N1832 Chronic kidney disease, stage 3b: Secondary | ICD-10-CM | POA: Diagnosis not present

## 2022-05-30 DIAGNOSIS — Z794 Long term (current) use of insulin: Secondary | ICD-10-CM | POA: Diagnosis not present

## 2022-05-30 DIAGNOSIS — E1022 Type 1 diabetes mellitus with diabetic chronic kidney disease: Secondary | ICD-10-CM | POA: Diagnosis not present

## 2022-05-30 DIAGNOSIS — E1042 Type 1 diabetes mellitus with diabetic polyneuropathy: Secondary | ICD-10-CM | POA: Diagnosis not present

## 2022-06-03 DIAGNOSIS — L97512 Non-pressure chronic ulcer of other part of right foot with fat layer exposed: Secondary | ICD-10-CM | POA: Diagnosis not present

## 2022-06-07 ENCOUNTER — Other Ambulatory Visit: Payer: Self-pay

## 2022-06-07 ENCOUNTER — Emergency Department (HOSPITAL_COMMUNITY)
Admission: EM | Admit: 2022-06-07 | Discharge: 2022-06-07 | Disposition: A | Discharge status: Home / Self Care | Payer: Medicare Other | Attending: Emergency Medicine | Admitting: Emergency Medicine

## 2022-06-07 ENCOUNTER — Encounter (HOSPITAL_COMMUNITY): Payer: Self-pay

## 2022-06-07 ENCOUNTER — Emergency Department (HOSPITAL_COMMUNITY): Payer: Medicare Other

## 2022-06-07 DIAGNOSIS — I10 Essential (primary) hypertension: Secondary | ICD-10-CM | POA: Diagnosis not present

## 2022-06-07 DIAGNOSIS — E161 Other hypoglycemia: Secondary | ICD-10-CM | POA: Diagnosis not present

## 2022-06-07 DIAGNOSIS — R55 Syncope and collapse: Secondary | ICD-10-CM | POA: Diagnosis not present

## 2022-06-07 DIAGNOSIS — E10649 Type 1 diabetes mellitus with hypoglycemia without coma: Secondary | ICD-10-CM | POA: Insufficient documentation

## 2022-06-07 DIAGNOSIS — Z794 Long term (current) use of insulin: Secondary | ICD-10-CM | POA: Diagnosis not present

## 2022-06-07 DIAGNOSIS — E162 Hypoglycemia, unspecified: Secondary | ICD-10-CM | POA: Diagnosis not present

## 2022-06-07 DIAGNOSIS — R0902 Hypoxemia: Secondary | ICD-10-CM | POA: Diagnosis not present

## 2022-06-07 DIAGNOSIS — E11649 Type 2 diabetes mellitus with hypoglycemia without coma: Secondary | ICD-10-CM | POA: Diagnosis not present

## 2022-06-07 LAB — BASIC METABOLIC PANEL
Anion gap: 15 (ref 5–15)
BUN: 27 mg/dL — ABNORMAL HIGH (ref 8–23)
CO2: 22 mmol/L (ref 22–32)
Calcium: 9.2 mg/dL (ref 8.9–10.3)
Chloride: 104 mmol/L (ref 98–111)
Creatinine, Ser: 2.09 mg/dL — ABNORMAL HIGH (ref 0.61–1.24)
GFR, Estimated: 33 mL/min — ABNORMAL LOW (ref >60)
Glucose, Bld: 90 mg/dL (ref 70–99)
Potassium: 4.2 mmol/L (ref 3.5–5.1)
Sodium: 141 mmol/L (ref 135–145)

## 2022-06-07 LAB — CBC WITH DIFFERENTIAL/PLATELET
Abs Immature Granulocytes: 0.05 10*3/uL (ref 0.00–0.07)
Basophils Absolute: 0.1 10*3/uL (ref 0.0–0.1)
Basophils Relative: 1 %
Eosinophils Absolute: 0.1 10*3/uL (ref 0.0–0.5)
Eosinophils Relative: 1 %
HCT: 36.8 % — ABNORMAL LOW (ref 39.0–52.0)
Hemoglobin: 12 g/dL — ABNORMAL LOW (ref 13.0–17.0)
Immature Granulocytes: 1 %
Lymphocytes Relative: 9 %
Lymphs Abs: 0.9 10*3/uL (ref 0.7–4.0)
MCH: 29.9 pg (ref 26.0–34.0)
MCHC: 32.6 g/dL (ref 30.0–36.0)
MCV: 91.5 fL (ref 80.0–100.0)
Monocytes Absolute: 0.8 10*3/uL (ref 0.1–1.0)
Monocytes Relative: 8 %
Neutro Abs: 8.4 10*3/uL — ABNORMAL HIGH (ref 1.7–7.7)
Neutrophils Relative %: 80 %
Platelets: 183 10*3/uL (ref 150–400)
RBC: 4.02 MIL/uL — ABNORMAL LOW (ref 4.22–5.81)
RDW: 13.6 % (ref 11.5–15.5)
WBC: 10.3 10*3/uL (ref 4.0–10.5)
nRBC: 0 % (ref 0.0–0.2)

## 2022-06-07 LAB — URINALYSIS, ROUTINE W REFLEX MICROSCOPIC
Bacteria, UA: NONE SEEN
Bilirubin Urine: NEGATIVE
Glucose, UA: NEGATIVE mg/dL
Hgb urine dipstick: NEGATIVE
Ketones, ur: NEGATIVE mg/dL
Leukocytes,Ua: NEGATIVE
Nitrite: NEGATIVE
Protein, ur: 30 mg/dL — AB
Specific Gravity, Urine: 1.013 (ref 1.005–1.030)
pH: 5 (ref 5.0–8.0)

## 2022-06-07 LAB — CBG MONITORING, ED
Glucose-Capillary: 86 mg/dL (ref 70–99)
Glucose-Capillary: 184 mg/dL — ABNORMAL HIGH (ref 70–99)

## 2022-06-07 LAB — TROPONIN I (HIGH SENSITIVITY)
Troponin I (High Sensitivity): 19 ng/L — ABNORMAL HIGH (ref <18)
Troponin I (High Sensitivity): 25 ng/L — ABNORMAL HIGH (ref <18)

## 2022-06-07 MED ORDER — INSULIN ASPART PROT & ASPART (70-30 MIX) 100 UNIT/ML ~~LOC~~ SUSP: 12.0000 [IU] | Freq: Every day | SUBCUTANEOUS | 11 refills | Status: AC

## 2022-06-07 MED ORDER — INSULIN ASPART 100 UNIT/ML IJ SOLN
12.0000 [IU] | Freq: Once | INTRAMUSCULAR | Status: AC
  Administered 2022-06-07: 12 [IU] via SUBCUTANEOUS

## 2022-06-07 NOTE — ED Triage Notes (Signed)
PT BIB EMS, wife woke up this morning and the pt was unresponsive, called EMS, upon arrival BS was 34, insulin pump was still in place.    D10 250 ML  CBG 123 130/79 HR 77 88% RA 96%2L Bipap at night

## 2022-06-07 NOTE — ED Provider Notes (Addendum)
[place] EMERGENCY DEPARTMENT AT [facility] Provider Note   CSN: [id number] Arrival date & time: 06/07/22  4782     History  Chief Complaint  Patient presents with   Hypoglycemia    [name] is a 72 y.o. male history of narcolepsy, MCI, type 1 diabetes presents with hyperglycemia.  Patient's wife woke up this morning and tried waking up her husband however patient was unresponsive.  EMS was called and patient's blood sugar was 34.  Patient was given D10 and patient's blood sugar up to 123.  Patient's insulin pump is in place.  Patient was 88% on room air and placed on 2 L nasal cannula and went up to 96%.  Patient is on BiPAP at night.  Patient does state yesterday he tried to change out the insulin pump cartridge but was unsure if he did it correctly and give himself 2 doses of NovoLog at home.  Patient states he has not units of insulin in a while.  Patient denies chest pain, neck pain, vision changes, new onset weakness, numbness tingling, abdominal pain, nausea/vomiting, dysuria, recent trauma, recent illness  Home Medications Prior to Admission medications   Medication Sig Start Date End Date Taking? Authorizing Provider  amLODipine (NORVASC) 5 MG tablet Take 5 mg by mouth daily.    [provider]  butalbital-aspirin-caffeine (FIORINAL) 50-325-40 MG capsule TAKE 1 CAPSULE BY MOUTH EVERY 4 HOURS AS NEEDED FOR SEVERE MIGRAINE 03/18/18   [provider]  CINNAMON PO Take 2 tablets by mouth daily.    [provider]  citalopram (CELEXA) 20 MG tablet Take 20 mg by mouth daily.    [provider]  cloNIDine (CATAPRES) 0.1 MG tablet Take 0.1 mg by mouth daily as needed (for high BP).    [provider]  diclofenac sodium (VOLTAREN) 1 % GEL APPLY 2 GRAMS EVERY 6 HOURS AS NEEDED FOR SHOULDER PAIN 01/25/18   [provider]  fluocinonide cream (LIDEX) 0.05 %  02/10/15   [provider]  HUMALOG 100 UNIT/ML  injection USE 90 UNITS DAY VIA INSULIN PUMP SUBCUTANEOUS 07/12/18   [provider]  hydrochlorothiazide (MICROZIDE) 12.5 MG capsule Take 25 mg by mouth daily.     [provider]  ibuprofen (ADVIL,MOTRIN) 200 MG tablet Take 200-400 mg by mouth every 6 (six) hours as needed for pain.    [provider]  Insulin Human (INSULIN PUMP) 100 unit/ml SOLN Inject 70 each into the skin continuous. Humalog insulin    [provider]  lisinopril (ZESTRIL) 10 MG tablet Take 10 mg by mouth daily. 05/07/18   [provider]  LISINOPRIL PO Take by mouth.    [provider]  Multiple Vitamin (MULTIVITAMIN WITH MINERALS) TABS tablet Take 1 tablet by mouth daily.    [provider]  olmesartan (BENICAR) 20 MG tablet Take 20 mg by mouth daily. 01/04/20   [provider]  omeprazole (PRILOSEC) 40 MG capsule Take by mouth.    [provider]  Polyethyl Glycol-Propyl Glycol 0.4-0.3 % SOLN Apply 2 drops to eye 3 (three) times daily.    [provider]  vitamin C (ASCORBIC ACID) 500 MG tablet Take 500 mg by mouth daily.    [provider]      Allergies    Codeine, Dilaudid [hydromorphone hcl], Gluten meal, Morphine and codeine, Other, Toradol [ketorolac tromethamine], and Penicillins    Review of Systems   Review of Systems See HPI Physical Exam Updated  Vital Signs BP (!) 148/82 (BP Location: Right Arm)   Pulse 79   Temp (!) 93.5 F (34.2 C) (Rectal)   Resp 20   Ht 6\' 3"  (1.905 m)   Wt 108.9 kg   SpO2 95%   BMI 30.00 kg/m  Physical Exam Vitals reviewed.  Constitutional:      General: He is not in acute distress. HENT:     Head: Normocephalic and atraumatic.  Eyes:     Extraocular Movements: Extraocular movements intact.     Conjunctiva/sclera: Conjunctivae normal.     Pupils: Pupils are equal, round, and reactive to light.  Cardiovascular:     Rate and Rhythm: Normal rate and regular rhythm.     Pulses:  Normal pulses.     Heart sounds: Normal heart sounds.     Comments: 2+ bilateral radial/dorsalis pedis pulses with regular rate Pulmonary:     Effort: Pulmonary effort is normal. No respiratory distress.     Breath sounds: Normal breath sounds.  Abdominal:     Palpations: Abdomen is soft.     Tenderness: There is no abdominal tenderness. There is no guarding or rebound.  Musculoskeletal:        General: Normal range of motion.     Cervical back: Normal range of motion and neck supple.     Comments: 5 out of 5 bilateral grip/leg extension strength  Skin:    General: Skin is warm and dry.     Capillary Refill: Capillary refill takes less than 2 seconds.  Neurological:     General: No focal deficit present.     Mental Status: He is alert and oriented to person, place, and time.     Sensory: Sensation is intact.     Motor: Motor function is intact.     Coordination: Coordination is intact.     Comments: Sensation intact in all 4 limbs Vision grossly intact Cranial nerves III through XII intact  Psychiatric:        Mood and Affect: Mood normal.     ED Results / Procedures / Treatments   Labs (all labs ordered are listed, but only abnormal results are displayed) Labs Reviewed  BASIC METABOLIC PANEL - Abnormal; Notable for the following components:      Result Value   BUN 27 (*)    Creatinine, Ser 2.09 (*)    GFR, Estimated 33 (*)    All other components within normal limits  CBC WITH DIFFERENTIAL/PLATELET - Abnormal; Notable for the following components:   RBC 4.02 (*)    Hemoglobin 12.0 (*)    HCT 36.8 (*)    Neutro Abs 8.4 (*)    All other components within normal limits  URINALYSIS, ROUTINE W REFLEX MICROSCOPIC - Abnormal; Notable for the following components:   Protein, ur 30 (*)    All other components within normal limits  CBG MONITORING, ED - Abnormal; Notable for the following components:   Glucose-Capillary 184 (*)    All other components within normal limits   TROPONIN I (HIGH SENSITIVITY) - Abnormal; Notable for the following components:   Troponin I (High Sensitivity) 19 (*)    All other components within normal limits  TROPONIN I (HIGH SENSITIVITY) - Abnormal; Notable for the following components:   Troponin I (High Sensitivity) 25 (*)    All other components within normal limits  CBG MONITORING, ED    EKG EKG Interpretation  Date/Time:  Saturday June 07 2022 07:51:29 EDT Ventricular Rate:  77 PR  Interval:  262 QRS Duration: 157 QT Interval:  460 QTC Calculation: 521 R Axis:   -49 Text Interpretation: Sinus rhythm Prolonged PR interval new RBBB and LAFB Probable lateral infarct, old Confirmed by Gwyneth Sprout ([id number]) on 06/07/2022 9:02:34 AM  Radiology No results found.  Procedures Procedures    Medications Ordered in ED Medications - No data to display  ED Course/ Medical Decision Making/ A&P                             Medical Decision Making Amount and/or Complexity of Data Reviewed Labs: ordered. Radiology: ordered.  Risk Prescription drug management.   Chauncey Fischer 72 y.o. presented today for AMS. Working DDx that I considered at this time includes, but not limited to, CVA, ICH, intracranial mass, critical dehydration, heptatic dysfunction, uremia, hypercarbia, intoxication, endocrine abnormality, toxidrome.  Review of prior external notes: 02/12/2022 ED provider  Unique Tests and My Interpretation:  CBG: 86 CBC: Unremarkable Troponin: Negative BMP: Decreased GFR 33, creatinine and BUN at baseline UA: Negative EKG: Sinus 77 bpm, prolonged PR interval, right bundle branch block and left anterior fascicular block  Discussion with Independent Historian: None  Discussion of Management of Tests: Lujean Rave, RN Diabetic Team  Risk: Low: based on diagnostic testing/clinical impression and treatment plan  Risk Stratification Score: None  Staffed with Plunkett, MD  R/o DDx: CVA, ICH,  intracranial mass, critical dehydration, heptatic dysfunction, uremia, hypercarbia, intoxication, endocrine abnormality, toxidrome: Not consistent with patient's history, physical exam, lab/imaging findings  Plan: Patient presented for unresponsiveness.  Patient was in no acute distress but was hypothermic at 93.5 F and wrapped in blankets during encounter.  I spoke to the patient and patient stated his wife had to wake him up this morning however he was unresponsive.  I tried to contact the wife however the wife was unable to pick up the phone and so I left a voicemail.  EMS gave patient 250 mL of D10 and patient sugar went up and he states he has been fine ever since.  Patient had reassuring ROS and physical exam including neuroexam.  When the attending assessed the patient and patient told the attending he tried to change out the insulin pump cartridge yesterday however was unsure if it was working and gave himself 2 doses of NovoLog which would explain patient's hypoglycemic state.  Patient is unsure of the amount of insulin NovoLog was but did state that he gives of 0.5 mL of NovoLog that is calibrated for the pump.  Labs will be ordered along with a head CT and EKG to fully assess patient however patient's unresponsiveness is most likely hypoglycemic state.  Patient stable at this time.  Patient's GFR came back at 33 with creatinine of 2.09.  Upon chart review 05/30/2022 progress note patient's GFR was 41 and his creatinine was 1.78 so low suspicion of AKI at this time.  On recheck patient's sugar was 188 as per his glucose reader.  Patient continues to be asymptomatic.  Patient has been able to drink soda, eat apple sauce and peanut butter which I suspect is why his sugar is 188 but patient does not show any signs of symptoms.  Patient's labs came back reassuring.  After speaking the attending we both agree that patient may be discharged with primary care follow-up as patient's LOC is most likely  related to his hypoglycemia.  I spoke to the patient about the importance of following  up with his primary care provider and the importance of not altering his insulin intake without consulting his physician first.  Patient states he feels comfortable with discharge at this time.  The diabetes RN assessed patient stated the patient needs 12 units of NovoLog before discharge and that patient will need to take NovoLog at night with dinner.  Patient may continue to use NovoLog twice daily until he is able to see his primary care provider.  NovoLog ordered in ED prior to discharge.  Patient was given return precautions. Patient stable for discharge at this time.  Patient verbalized understanding of plan.  Diabetic nurse called back after patient been discharged saying that patient received NovoLog and NovoLog 70/30 before discharge and that patient is at risk for hypoglycemia and need to be monitored.  Since patient was on the ED I called the patient and relayed this information to them and the importance of monitoring him.  Patient stated he felt comfortable monitoring his glucose with his glucose monitor and that he has glucose at home if his sugars are to drop.  I spoke to the patient about the risks of [place] not being able to monitor him and patient with full decision-making capacity stated that he feels comfortable not being observed in the ED and that he can monitor his sugars on his own.   Final Clinical Impression(s) / ED Diagnoses Final diagnoses:  Hypoglycemia    Rx / DC Orders ED Discharge Orders     None         Remi Deter 06/07/22 1156    Netta Corrigan, PA-C 06/07/22 1227    Gwyneth Sprout, MD 06/07/22 2059

## 2022-06-07 NOTE — Discharge Instructions (Addendum)
Please follow-up with your primary care provider due to recent symptoms and ER visit.  Today your sugar was low which cause you to have loss of consciousness.  You will need to follow-up with her primary care provider to have your pump reassessed.  You are given a dose of the NovoLog here in the emergency room today and you may start using her NovoLog again at dinner tonight.  Please use 12 units twice daily.  If symptoms worsen please return to ER.

## 2022-06-07 NOTE — ED Notes (Signed)
Per patients dexacom, blood sugar is 188.

## 2022-06-07 NOTE — ED Notes (Signed)
Patient currently on the phone with a diabetic coordinator discussing medical diabetic supplies and his needs.

## 2022-06-07 NOTE — Inpatient Diabetes Management (Signed)
Inpatient Diabetes Program Recommendations  AACE/ADA: New Consensus Statement on Inpatient Glycemic Control (2015)  Target Ranges:  Prepandial:   less than 140 mg/dL      Peak postprandial:   less than 180 mg/dL (1-2 hours)      Critically ill patients:  140 - 180 mg/dL   Lab Results  Component Value Date   GLUCAP 184 (H) 06/07/2022    Review of Glycemic Control  Latest Reference Range & Units 06/07/22 07:44 06/07/22 10:38  Glucose-Capillary 70 - 99 mg/dL 86 409 (H)  (H): Data is abnormally high Diabetes history: Type 1 DM Outpatient Diabetes medications: Humalog- insulin pump Current orders for Inpatient glycemic control: none  Inpatient Diabetes Program Recommendations:    Received page from RN.  Spoke with patient regarding hypoglycemic event this AM. Patient reports that he changed out his cannula insertion site yesterday, but then realized he had no additional supplies. He reinserted the old insertion site, however noted that blood sugar was increasing to 300's mg/dL. As a result he gave himself Humalog but does not remember dose and ended up in ED from hypoglycemia.  He obtains additional supplies through [place] and plans to call for them to send. Explained that hospital does not carry additional insulin pump supplies.  Is seen by Dr Sharl Ma, outpatient endocrinology. Unable to view insulin pump settings, nor dose patient know settings or back up doses. Insulin pump was removed by EMS and left at patient's home. Has worn pump for 8 years and relies solely on the pump for insulin delivery.   Reviewed basal insulin vs short acting insulin, surivial skills, interventions and how to adminsiter. Unable to afford Lantus due to fixed income, however, can afford 70/30 vial from [facility] for $24.   At this time, recommend Novolog 70/30 12 x 1, then Novolin 70/30 12 units BID to follow.   Patient understands that he will use the Novolin 70/30 with breakfast and dinner until pump can be  reapplied after 12 hours from previous 70/30 dose. No further questions.   Secure chat sent with recommendations.  At 1210 noted patient received Novolog 12 units x1. Secure chat sent to PA and MD regarding differences in insulin compared to recommendation. Anticipate further hypoglycemia given Novolog order. Encouraged patient to eat and for team to follow CBGs.   Thanks, Lujean Rave, MSN, RNC-OB Diabetes Coordinator [phone] (8a-5p)

## 2022-06-09 DIAGNOSIS — E1065 Type 1 diabetes mellitus with hyperglycemia: Secondary | ICD-10-CM | POA: Diagnosis not present

## 2022-06-12 ENCOUNTER — Telehealth: Payer: Self-pay

## 2022-06-12 NOTE — Telephone Encounter (Signed)
Transition Care Management Follow-up Telephone Call Date of discharge and from where: [name] 6/1 How have you been since you were released from the hospital? Doing Fine  Any questions or concerns? No  Items Reviewed: Did the pt receive and understand the discharge instructions provided? Yes  Medications obtained and verified? Yes  Other? No  Any new allergies since your discharge? No  Dietary orders reviewed? No Do you have support at home? Yes     Follow up appointments reviewed:  PCP Hospital f/u appt confirmed? Yes Scheduled to see  on  @ . [facility] f/u appt confirmed? No  Scheduled to see  on  @ . Are transportation arrangements needed? No  If their condition worsens, is the pt aware to call PCP or go to the Emergency Dept.? Yes Was the patient provided with contact information for the PCP's office or ED? Yes Was to pt encouraged to call back with questions or concerns? Yes

## 2022-06-16 DIAGNOSIS — E1065 Type 1 diabetes mellitus with hyperglycemia: Secondary | ICD-10-CM | POA: Diagnosis not present

## 2022-06-17 DIAGNOSIS — L97512 Non-pressure chronic ulcer of other part of right foot with fat layer exposed: Secondary | ICD-10-CM | POA: Diagnosis not present

## 2022-06-23 DIAGNOSIS — E1065 Type 1 diabetes mellitus with hyperglycemia: Secondary | ICD-10-CM | POA: Diagnosis not present

## 2022-06-30 DIAGNOSIS — E1065 Type 1 diabetes mellitus with hyperglycemia: Secondary | ICD-10-CM | POA: Diagnosis not present

## 2022-07-07 DIAGNOSIS — E1065 Type 1 diabetes mellitus with hyperglycemia: Secondary | ICD-10-CM | POA: Diagnosis not present

## 2022-07-14 DIAGNOSIS — E1065 Type 1 diabetes mellitus with hyperglycemia: Secondary | ICD-10-CM | POA: Diagnosis not present

## 2022-07-17 DIAGNOSIS — L97512 Non-pressure chronic ulcer of other part of right foot with fat layer exposed: Secondary | ICD-10-CM | POA: Diagnosis not present

## 2022-07-21 DIAGNOSIS — E1065 Type 1 diabetes mellitus with hyperglycemia: Secondary | ICD-10-CM | POA: Diagnosis not present

## 2022-07-23 ENCOUNTER — Ambulatory Visit: Payer: Medicare Other | Admitting: Adult Health

## 2022-07-23 DIAGNOSIS — K219 Gastro-esophageal reflux disease without esophagitis: Secondary | ICD-10-CM | POA: Diagnosis not present

## 2022-07-23 DIAGNOSIS — E10620 Type 1 diabetes mellitus with diabetic dermatitis: Secondary | ICD-10-CM | POA: Diagnosis not present

## 2022-07-23 DIAGNOSIS — G4730 Sleep apnea, unspecified: Secondary | ICD-10-CM | POA: Diagnosis not present

## 2022-07-23 DIAGNOSIS — M1990 Unspecified osteoarthritis, unspecified site: Secondary | ICD-10-CM | POA: Diagnosis not present

## 2022-07-23 DIAGNOSIS — I10 Essential (primary) hypertension: Secondary | ICD-10-CM | POA: Diagnosis not present

## 2022-07-23 DIAGNOSIS — E669 Obesity, unspecified: Secondary | ICD-10-CM | POA: Diagnosis not present

## 2022-07-23 DIAGNOSIS — E10319 Type 1 diabetes mellitus with unspecified diabetic retinopathy without macular edema: Secondary | ICD-10-CM | POA: Diagnosis not present

## 2022-07-23 DIAGNOSIS — F39 Unspecified mood [affective] disorder: Secondary | ICD-10-CM | POA: Diagnosis not present

## 2022-07-28 DIAGNOSIS — E1065 Type 1 diabetes mellitus with hyperglycemia: Secondary | ICD-10-CM | POA: Diagnosis not present

## 2022-07-28 DIAGNOSIS — H34831 Tributary (branch) retinal vein occlusion, right eye, with macular edema: Secondary | ICD-10-CM | POA: Diagnosis not present

## 2022-07-31 DIAGNOSIS — E103593 Type 1 diabetes mellitus with proliferative diabetic retinopathy without macular edema, bilateral: Secondary | ICD-10-CM | POA: Diagnosis not present

## 2022-07-31 DIAGNOSIS — Z961 Presence of intraocular lens: Secondary | ICD-10-CM | POA: Diagnosis not present

## 2022-07-31 DIAGNOSIS — H40013 Open angle with borderline findings, low risk, bilateral: Secondary | ICD-10-CM | POA: Diagnosis not present

## 2022-08-04 DIAGNOSIS — E1065 Type 1 diabetes mellitus with hyperglycemia: Secondary | ICD-10-CM | POA: Diagnosis not present

## 2022-08-05 DIAGNOSIS — L97512 Non-pressure chronic ulcer of other part of right foot with fat layer exposed: Secondary | ICD-10-CM | POA: Diagnosis not present

## 2022-08-06 ENCOUNTER — Encounter: Payer: Self-pay | Admitting: Adult Health

## 2022-08-06 ENCOUNTER — Ambulatory Visit: Payer: Medicare Other | Admitting: Adult Health

## 2022-08-06 VITALS — BP 133/70 | HR 89 | Ht 76.8 in | Wt 241.0 lb

## 2022-08-06 DIAGNOSIS — G4731 Primary central sleep apnea: Secondary | ICD-10-CM

## 2022-08-06 DIAGNOSIS — Z9989 Dependence on other enabling machines and devices: Secondary | ICD-10-CM

## 2022-08-06 NOTE — Patient Instructions (Signed)
Your Plan:  Continue using CPAP nightly and greater than 4 hours each night If your symptoms worsen or you develop new symptoms please let us know.       Thank you for coming to see us at [facility]. I hope we have been able to provide you high quality care today.  You may receive a patient satisfaction survey over the next few weeks. We would appreciate your feedback and comments so that we may continue to improve ourselves and the health of our patients.  

## 2022-08-11 DIAGNOSIS — E1065 Type 1 diabetes mellitus with hyperglycemia: Secondary | ICD-10-CM | POA: Diagnosis not present

## 2022-08-18 DIAGNOSIS — E1065 Type 1 diabetes mellitus with hyperglycemia: Secondary | ICD-10-CM | POA: Diagnosis not present

## 2022-08-20 DIAGNOSIS — K080 Exfoliation of teeth due to systemic causes: Secondary | ICD-10-CM | POA: Diagnosis not present

## 2022-08-22 DIAGNOSIS — C4442 Squamous cell carcinoma of skin of scalp and neck: Secondary | ICD-10-CM | POA: Diagnosis not present

## 2022-08-22 DIAGNOSIS — D485 Neoplasm of uncertain behavior of skin: Secondary | ICD-10-CM | POA: Diagnosis not present

## 2022-08-22 DIAGNOSIS — L859 Epidermal thickening, unspecified: Secondary | ICD-10-CM | POA: Diagnosis not present

## 2022-08-22 DIAGNOSIS — L308 Other specified dermatitis: Secondary | ICD-10-CM | POA: Diagnosis not present

## 2022-08-22 DIAGNOSIS — L570 Actinic keratosis: Secondary | ICD-10-CM | POA: Diagnosis not present

## 2022-08-22 DIAGNOSIS — Z85828 Personal history of other malignant neoplasm of skin: Secondary | ICD-10-CM | POA: Diagnosis not present

## 2022-08-25 DIAGNOSIS — E1065 Type 1 diabetes mellitus with hyperglycemia: Secondary | ICD-10-CM | POA: Diagnosis not present

## 2022-08-28 DIAGNOSIS — E1022 Type 1 diabetes mellitus with diabetic chronic kidney disease: Secondary | ICD-10-CM | POA: Diagnosis not present

## 2022-08-28 DIAGNOSIS — E1042 Type 1 diabetes mellitus with diabetic polyneuropathy: Secondary | ICD-10-CM | POA: Diagnosis not present

## 2022-08-28 DIAGNOSIS — Z794 Long term (current) use of insulin: Secondary | ICD-10-CM | POA: Diagnosis not present

## 2022-08-28 DIAGNOSIS — N1832 Chronic kidney disease, stage 3b: Secondary | ICD-10-CM | POA: Diagnosis not present

## 2022-08-29 DIAGNOSIS — K080 Exfoliation of teeth due to systemic causes: Secondary | ICD-10-CM | POA: Diagnosis not present

## 2022-09-02 DIAGNOSIS — I129 Hypertensive chronic kidney disease with stage 1 through stage 4 chronic kidney disease, or unspecified chronic kidney disease: Secondary | ICD-10-CM | POA: Diagnosis not present

## 2022-09-02 DIAGNOSIS — N1832 Chronic kidney disease, stage 3b: Secondary | ICD-10-CM | POA: Diagnosis not present

## 2022-09-02 DIAGNOSIS — D631 Anemia in chronic kidney disease: Secondary | ICD-10-CM | POA: Diagnosis not present

## 2022-09-02 DIAGNOSIS — E1065 Type 1 diabetes mellitus with hyperglycemia: Secondary | ICD-10-CM | POA: Diagnosis not present

## 2022-09-02 DIAGNOSIS — E1022 Type 1 diabetes mellitus with diabetic chronic kidney disease: Secondary | ICD-10-CM | POA: Diagnosis not present

## 2022-09-03 DIAGNOSIS — M75111 Incomplete rotator cuff tear or rupture of right shoulder, not specified as traumatic: Secondary | ICD-10-CM | POA: Diagnosis not present

## 2022-09-03 DIAGNOSIS — M25512 Pain in left shoulder: Secondary | ICD-10-CM | POA: Diagnosis not present

## 2022-09-03 DIAGNOSIS — M7541 Impingement syndrome of right shoulder: Secondary | ICD-10-CM | POA: Diagnosis not present

## 2022-09-09 DIAGNOSIS — E1065 Type 1 diabetes mellitus with hyperglycemia: Secondary | ICD-10-CM | POA: Diagnosis not present

## 2022-09-16 DIAGNOSIS — E1065 Type 1 diabetes mellitus with hyperglycemia: Secondary | ICD-10-CM | POA: Diagnosis not present

## 2022-09-23 DIAGNOSIS — E1065 Type 1 diabetes mellitus with hyperglycemia: Secondary | ICD-10-CM | POA: Diagnosis not present

## 2022-09-30 DIAGNOSIS — E1065 Type 1 diabetes mellitus with hyperglycemia: Secondary | ICD-10-CM | POA: Diagnosis not present

## 2022-10-07 DIAGNOSIS — E1065 Type 1 diabetes mellitus with hyperglycemia: Secondary | ICD-10-CM | POA: Diagnosis not present

## 2022-10-14 DIAGNOSIS — E1065 Type 1 diabetes mellitus with hyperglycemia: Secondary | ICD-10-CM | POA: Diagnosis not present

## 2022-10-21 DIAGNOSIS — E1065 Type 1 diabetes mellitus with hyperglycemia: Secondary | ICD-10-CM | POA: Diagnosis not present

## 2022-10-28 DIAGNOSIS — E1065 Type 1 diabetes mellitus with hyperglycemia: Secondary | ICD-10-CM | POA: Diagnosis not present

## 2022-10-29 DIAGNOSIS — H34831 Tributary (branch) retinal vein occlusion, right eye, with macular edema: Secondary | ICD-10-CM | POA: Diagnosis not present

## 2022-11-04 DIAGNOSIS — E1065 Type 1 diabetes mellitus with hyperglycemia: Secondary | ICD-10-CM | POA: Diagnosis not present

## 2022-11-11 DIAGNOSIS — E1065 Type 1 diabetes mellitus with hyperglycemia: Secondary | ICD-10-CM | POA: Diagnosis not present

## 2022-11-18 DIAGNOSIS — Z794 Long term (current) use of insulin: Secondary | ICD-10-CM | POA: Diagnosis not present

## 2022-11-18 DIAGNOSIS — E1065 Type 1 diabetes mellitus with hyperglycemia: Secondary | ICD-10-CM | POA: Diagnosis not present

## 2022-11-18 DIAGNOSIS — E1042 Type 1 diabetes mellitus with diabetic polyneuropathy: Secondary | ICD-10-CM | POA: Diagnosis not present

## 2022-11-18 DIAGNOSIS — N1832 Chronic kidney disease, stage 3b: Secondary | ICD-10-CM | POA: Diagnosis not present

## 2022-11-18 DIAGNOSIS — E1022 Type 1 diabetes mellitus with diabetic chronic kidney disease: Secondary | ICD-10-CM | POA: Diagnosis not present

## 2022-11-25 DIAGNOSIS — E1065 Type 1 diabetes mellitus with hyperglycemia: Secondary | ICD-10-CM | POA: Diagnosis not present

## 2022-12-09 DIAGNOSIS — K080 Exfoliation of teeth due to systemic causes: Secondary | ICD-10-CM | POA: Diagnosis not present

## 2022-12-22 DIAGNOSIS — K080 Exfoliation of teeth due to systemic causes: Secondary | ICD-10-CM | POA: Diagnosis not present

## 2022-12-24 DIAGNOSIS — E1065 Type 1 diabetes mellitus with hyperglycemia: Secondary | ICD-10-CM | POA: Diagnosis not present

## 2022-12-24 DIAGNOSIS — E1021 Type 1 diabetes mellitus with diabetic nephropathy: Secondary | ICD-10-CM | POA: Diagnosis not present

## 2022-12-31 DIAGNOSIS — E1021 Type 1 diabetes mellitus with diabetic nephropathy: Secondary | ICD-10-CM | POA: Diagnosis not present

## 2022-12-31 DIAGNOSIS — E1065 Type 1 diabetes mellitus with hyperglycemia: Secondary | ICD-10-CM | POA: Diagnosis not present

## 2023-01-05 DIAGNOSIS — G4731 Primary central sleep apnea: Secondary | ICD-10-CM | POA: Diagnosis not present

## 2023-01-07 DIAGNOSIS — E1065 Type 1 diabetes mellitus with hyperglycemia: Secondary | ICD-10-CM | POA: Diagnosis not present

## 2023-01-07 DIAGNOSIS — E1021 Type 1 diabetes mellitus with diabetic nephropathy: Secondary | ICD-10-CM | POA: Diagnosis not present

## 2023-01-14 DIAGNOSIS — E1021 Type 1 diabetes mellitus with diabetic nephropathy: Secondary | ICD-10-CM | POA: Diagnosis not present

## 2023-01-14 DIAGNOSIS — H34831 Tributary (branch) retinal vein occlusion, right eye, with macular edema: Secondary | ICD-10-CM | POA: Diagnosis not present

## 2023-01-14 DIAGNOSIS — E1065 Type 1 diabetes mellitus with hyperglycemia: Secondary | ICD-10-CM | POA: Diagnosis not present

## 2023-01-20 DIAGNOSIS — E1065 Type 1 diabetes mellitus with hyperglycemia: Secondary | ICD-10-CM | POA: Diagnosis not present

## 2023-01-20 DIAGNOSIS — E1021 Type 1 diabetes mellitus with diabetic nephropathy: Secondary | ICD-10-CM | POA: Diagnosis not present

## 2023-01-21 DIAGNOSIS — E1065 Type 1 diabetes mellitus with hyperglycemia: Secondary | ICD-10-CM | POA: Diagnosis not present

## 2023-01-21 DIAGNOSIS — E1021 Type 1 diabetes mellitus with diabetic nephropathy: Secondary | ICD-10-CM | POA: Diagnosis not present

## 2023-01-28 DIAGNOSIS — E1021 Type 1 diabetes mellitus with diabetic nephropathy: Secondary | ICD-10-CM | POA: Diagnosis not present

## 2023-01-28 DIAGNOSIS — E1065 Type 1 diabetes mellitus with hyperglycemia: Secondary | ICD-10-CM | POA: Diagnosis not present

## 2023-01-29 ENCOUNTER — Ambulatory Visit: Payer: Medicare Other | Admitting: Adult Health

## 2023-02-04 DIAGNOSIS — E1021 Type 1 diabetes mellitus with diabetic nephropathy: Secondary | ICD-10-CM | POA: Diagnosis not present

## 2023-02-04 DIAGNOSIS — E1065 Type 1 diabetes mellitus with hyperglycemia: Secondary | ICD-10-CM | POA: Diagnosis not present

## 2023-02-04 DIAGNOSIS — E785 Hyperlipidemia, unspecified: Secondary | ICD-10-CM | POA: Diagnosis not present

## 2023-02-04 DIAGNOSIS — Z125 Encounter for screening for malignant neoplasm of prostate: Secondary | ICD-10-CM | POA: Diagnosis not present

## 2023-02-04 DIAGNOSIS — E1039 Type 1 diabetes mellitus with other diabetic ophthalmic complication: Secondary | ICD-10-CM | POA: Diagnosis not present

## 2023-02-05 DIAGNOSIS — G4731 Primary central sleep apnea: Secondary | ICD-10-CM | POA: Diagnosis not present

## 2023-02-11 DIAGNOSIS — I129 Hypertensive chronic kidney disease with stage 1 through stage 4 chronic kidney disease, or unspecified chronic kidney disease: Secondary | ICD-10-CM | POA: Diagnosis not present

## 2023-02-11 DIAGNOSIS — Z1339 Encounter for screening examination for other mental health and behavioral disorders: Secondary | ICD-10-CM | POA: Diagnosis not present

## 2023-02-11 DIAGNOSIS — Z1331 Encounter for screening for depression: Secondary | ICD-10-CM | POA: Diagnosis not present

## 2023-02-11 DIAGNOSIS — R82998 Other abnormal findings in urine: Secondary | ICD-10-CM | POA: Diagnosis not present

## 2023-02-11 DIAGNOSIS — Z0000 Encounter for general adult medical examination without abnormal findings: Secondary | ICD-10-CM | POA: Diagnosis not present

## 2023-02-11 DIAGNOSIS — E1029 Type 1 diabetes mellitus with other diabetic kidney complication: Secondary | ICD-10-CM | POA: Diagnosis not present

## 2023-02-11 DIAGNOSIS — E1021 Type 1 diabetes mellitus with diabetic nephropathy: Secondary | ICD-10-CM | POA: Diagnosis not present

## 2023-02-11 DIAGNOSIS — E1065 Type 1 diabetes mellitus with hyperglycemia: Secondary | ICD-10-CM | POA: Diagnosis not present

## 2023-02-18 DIAGNOSIS — Z794 Long term (current) use of insulin: Secondary | ICD-10-CM | POA: Diagnosis not present

## 2023-02-18 DIAGNOSIS — E1021 Type 1 diabetes mellitus with diabetic nephropathy: Secondary | ICD-10-CM | POA: Diagnosis not present

## 2023-02-18 DIAGNOSIS — E1042 Type 1 diabetes mellitus with diabetic polyneuropathy: Secondary | ICD-10-CM | POA: Diagnosis not present

## 2023-02-18 DIAGNOSIS — E1065 Type 1 diabetes mellitus with hyperglycemia: Secondary | ICD-10-CM | POA: Diagnosis not present

## 2023-02-18 DIAGNOSIS — E1022 Type 1 diabetes mellitus with diabetic chronic kidney disease: Secondary | ICD-10-CM | POA: Diagnosis not present

## 2023-02-18 DIAGNOSIS — N1832 Chronic kidney disease, stage 3b: Secondary | ICD-10-CM | POA: Diagnosis not present

## 2023-02-25 DIAGNOSIS — E1021 Type 1 diabetes mellitus with diabetic nephropathy: Secondary | ICD-10-CM | POA: Diagnosis not present

## 2023-02-25 DIAGNOSIS — E1065 Type 1 diabetes mellitus with hyperglycemia: Secondary | ICD-10-CM | POA: Diagnosis not present

## 2023-03-04 DIAGNOSIS — E1065 Type 1 diabetes mellitus with hyperglycemia: Secondary | ICD-10-CM | POA: Diagnosis not present

## 2023-03-04 DIAGNOSIS — E1021 Type 1 diabetes mellitus with diabetic nephropathy: Secondary | ICD-10-CM | POA: Diagnosis not present

## 2023-03-06 DIAGNOSIS — G4731 Primary central sleep apnea: Secondary | ICD-10-CM | POA: Diagnosis not present

## 2023-03-11 DIAGNOSIS — E1065 Type 1 diabetes mellitus with hyperglycemia: Secondary | ICD-10-CM | POA: Diagnosis not present

## 2023-03-11 DIAGNOSIS — E1021 Type 1 diabetes mellitus with diabetic nephropathy: Secondary | ICD-10-CM | POA: Diagnosis not present

## 2023-03-18 DIAGNOSIS — E1065 Type 1 diabetes mellitus with hyperglycemia: Secondary | ICD-10-CM | POA: Diagnosis not present

## 2023-03-18 DIAGNOSIS — E1021 Type 1 diabetes mellitus with diabetic nephropathy: Secondary | ICD-10-CM | POA: Diagnosis not present

## 2023-03-19 DIAGNOSIS — J069 Acute upper respiratory infection, unspecified: Secondary | ICD-10-CM | POA: Diagnosis not present

## 2023-03-19 DIAGNOSIS — J029 Acute pharyngitis, unspecified: Secondary | ICD-10-CM | POA: Diagnosis not present

## 2023-04-22 DIAGNOSIS — H34831 Tributary (branch) retinal vein occlusion, right eye, with macular edema: Secondary | ICD-10-CM | POA: Diagnosis not present

## 2023-05-01 DIAGNOSIS — Z794 Long term (current) use of insulin: Secondary | ICD-10-CM | POA: Diagnosis not present

## 2023-05-01 DIAGNOSIS — E1021 Type 1 diabetes mellitus with diabetic nephropathy: Secondary | ICD-10-CM | POA: Diagnosis not present

## 2023-05-01 DIAGNOSIS — Z9641 Presence of insulin pump (external) (internal): Secondary | ICD-10-CM | POA: Diagnosis not present

## 2023-05-08 DIAGNOSIS — Z794 Long term (current) use of insulin: Secondary | ICD-10-CM | POA: Diagnosis not present

## 2023-05-15 DIAGNOSIS — Z794 Long term (current) use of insulin: Secondary | ICD-10-CM | POA: Diagnosis not present

## 2023-05-20 DIAGNOSIS — Z794 Long term (current) use of insulin: Secondary | ICD-10-CM | POA: Diagnosis not present

## 2023-05-20 DIAGNOSIS — N1832 Chronic kidney disease, stage 3b: Secondary | ICD-10-CM | POA: Diagnosis not present

## 2023-05-20 DIAGNOSIS — E1042 Type 1 diabetes mellitus with diabetic polyneuropathy: Secondary | ICD-10-CM | POA: Diagnosis not present

## 2023-05-20 DIAGNOSIS — E1022 Type 1 diabetes mellitus with diabetic chronic kidney disease: Secondary | ICD-10-CM | POA: Diagnosis not present

## 2023-05-22 DIAGNOSIS — Z794 Long term (current) use of insulin: Secondary | ICD-10-CM | POA: Diagnosis not present

## 2023-05-29 DIAGNOSIS — Z794 Long term (current) use of insulin: Secondary | ICD-10-CM | POA: Diagnosis not present

## 2023-06-05 DIAGNOSIS — Z794 Long term (current) use of insulin: Secondary | ICD-10-CM | POA: Diagnosis not present

## 2023-06-12 DIAGNOSIS — Z794 Long term (current) use of insulin: Secondary | ICD-10-CM | POA: Diagnosis not present

## 2023-06-16 DIAGNOSIS — K080 Exfoliation of teeth due to systemic causes: Secondary | ICD-10-CM | POA: Diagnosis not present

## 2023-06-19 DIAGNOSIS — Z794 Long term (current) use of insulin: Secondary | ICD-10-CM | POA: Diagnosis not present

## 2023-06-22 DIAGNOSIS — K080 Exfoliation of teeth due to systemic causes: Secondary | ICD-10-CM | POA: Diagnosis not present

## 2023-06-26 DIAGNOSIS — Z794 Long term (current) use of insulin: Secondary | ICD-10-CM | POA: Diagnosis not present

## 2023-07-03 DIAGNOSIS — Z794 Long term (current) use of insulin: Secondary | ICD-10-CM | POA: Diagnosis not present

## 2023-07-08 DIAGNOSIS — H34831 Tributary (branch) retinal vein occlusion, right eye, with macular edema: Secondary | ICD-10-CM | POA: Diagnosis not present

## 2023-07-10 DIAGNOSIS — Z794 Long term (current) use of insulin: Secondary | ICD-10-CM | POA: Diagnosis not present

## 2023-07-17 DIAGNOSIS — Z794 Long term (current) use of insulin: Secondary | ICD-10-CM | POA: Diagnosis not present

## 2023-07-24 DIAGNOSIS — Z794 Long term (current) use of insulin: Secondary | ICD-10-CM | POA: Diagnosis not present

## 2023-08-03 ENCOUNTER — Telehealth: Payer: Self-pay | Admitting: Adult Health

## 2023-08-03 NOTE — Telephone Encounter (Signed)
 Pt asked to speak with billing dept to see if an arrangement could be made so he is able to keep upcoming appointment

## 2023-08-06 ENCOUNTER — Ambulatory Visit: Payer: Medicare Other | Admitting: Adult Health

## 2023-08-06 ENCOUNTER — Encounter: Payer: Self-pay | Admitting: Adult Health

## 2023-08-06 VITALS — BP 154/88 | HR 74 | Ht 76.0 in | Wt 233.0 lb

## 2023-08-06 DIAGNOSIS — G4731 Primary central sleep apnea: Secondary | ICD-10-CM | POA: Diagnosis not present

## 2023-08-06 DIAGNOSIS — Z9989 Dependence on other enabling machines and devices: Secondary | ICD-10-CM

## 2023-08-06 NOTE — Patient Instructions (Signed)
 Your Plan:  Continue ASV nightly Will order a mask refitting  I am so happy that you feel so much improvement with your health!   Thank you for coming to see us  at [facility]. I hope we have been able to provide you high quality care today.  You may receive a patient satisfaction survey over the next few weeks. We would appreciate your feedback and comments so that we may continue to improve ourselves and the health of our patients.

## 2023-08-06 NOTE — Progress Notes (Signed)
 PATIENT: [name] DOB: 1950-09-07  REASON FOR VISIT: follow up HISTORY FROM: patient PRIMARY NEUROLOGIST: Dr. Chalice  Chief Complaint  Patient presents with   Follow-up    Pt in 4 alone pt here for cpap f/u Pt states has questions about the number events he has during the night      HISTORY OF PRESENT ILLNESS: Today 08/06/23:  [name] is a 73 y.o. male with a history of obstructive sleep apnea on ASV. Returns today for follow-up.  He reports that he has been doing really well.  He states his overall health has improved.  States that his eyesight and hearing is better his mind is also clear.  His download is below       08/06/22: [name] is a 73 y.o. male with a history of OSA on CPAP. Returns today for follow-up.  Reports that CPAP is working well for him.  Denies any new issues.  His download is below      [name] is a 73 year old male with a history of OSA On ASV. He returns today for follow-up. Reports machine is working well for him. Not happy with the billing department at [employer]. Thinking about switching to another DME    07/18/20: [name] is a 73 year old male with a history of obstructive sleep apnea on CPAP.  He returns today for follow-up.  He reports that the BiPAP continues to work fairly well for him.  He does note that his mask leaks but he is not able to get it any tighter.  He does not wish to try to a different style of mask.  He denies any new issues.  He returns today for an evaluation.     REVIEW OF SYSTEMS: Out of a complete 14 system review of symptoms, the patient complains only of the following symptoms, and all other reviewed systems are negative.  ESS 4   ALLERGIES: Allergies  Allergen Reactions   Codeine Anaphylaxis    Last time, came to ED   Dilaudid [Hydromorphone Hcl] Anaphylaxis    Patient goes into cardiac arrest   Gluten Meal Other (See Comments)    Severe migraines   Morphine And Codeine  Anaphylaxis   Other Anaphylaxis   Toradol  [Ketorolac  Tromethamine ] Nausea And Vomiting   Penicillins Other (See Comments)    Unknown     HOME MEDICATIONS: Outpatient Medications Prior to Visit  Medication Sig Dispense Refill   amLODipine (NORVASC) 5 MG tablet Take 5 mg by mouth daily.     butalbital-aspirin-caffeine (FIORINAL) 50-325-40 MG capsule TAKE 1 CAPSULE BY MOUTH EVERY 4 HOURS AS NEEDED FOR SEVERE MIGRAINE     CINNAMON PO Take 2 tablets by mouth daily.     citalopram (CELEXA) 20 MG tablet Take 20 mg by mouth daily.     cloNIDine (CATAPRES) 0.1 MG tablet Take 0.1 mg by mouth daily as needed (for high BP).     diclofenac sodium (VOLTAREN) 1 % GEL APPLY 2 GRAMS EVERY 6 HOURS AS NEEDED FOR SHOULDER PAIN     fluocinonide cream (LIDEX) 0.05 %   2   HUMALOG 100 UNIT/ML injection USE 90 UNITS DAY VIA INSULIN  PUMP SUBCUTANEOUS     hydrochlorothiazide (MICROZIDE) 12.5 MG capsule Take 25 mg by mouth daily.      ibuprofen  (ADVIL ,MOTRIN ) 200 MG tablet Take 200-400 mg by mouth every 6 (six) hours as needed for pain.     insulin  aspart protamine- aspart (NOVOLOG  MIX  70/30) (70-30) 100 UNIT/ML injection Inject 0.12 mLs (12 Units total) into the skin daily with supper. 10 mL 11   Insulin  Human (INSULIN  PUMP) 100 unit/ml SOLN Inject 70 each into the skin continuous. Humalog insulin      lisinopril (ZESTRIL) 10 MG tablet Take 10 mg by mouth daily.     LISINOPRIL PO Take by mouth.     Multiple Vitamin (MULTIVITAMIN WITH MINERALS) TABS tablet Take 1 tablet by mouth daily.     olmesartan (BENICAR) 20 MG tablet Take 20 mg by mouth daily.     omeprazole (PRILOSEC) 40 MG capsule Take by mouth.     Polyethyl Glycol-Propyl Glycol 0.4-0.3 % SOLN Apply 2 drops to eye 3 (three) times daily.     vitamin C (ASCORBIC ACID) 500 MG tablet Take 500 mg by mouth daily.     No facility-administered medications prior to visit.    PAST MEDICAL HISTORY: Past Medical History:  Diagnosis Date   Anxiety    Arthritis     Diabetes mellitus without complication (HCC)    type 1, insulin  pump   Head injury 09/16/2012   12 stitches   Hypertension    Migraine    Narcolepsy without cataplexy with hypocretin deficiency 11/23/2013    HLA testing positive for narcolepsy    OSA (obstructive sleep apnea)    OSA treated with BiPAP    Sleep apnea with use of continuous positive airway pressure (CPAP)    sleep study revealed centrals, [facility] SV EPAP at 10 cm water 02-19-11     PAST SURGICAL HISTORY: Past Surgical History:  Procedure Laterality Date   COLON SURGERY  2010   collapsed colon   TRIGGER FINGER RELEASE     x4    FAMILY HISTORY: Family History  Problem Relation Age of Onset   Arthritis Sister    Sleep apnea Brother    Diabetes Other    Heart failure Mother    Diabetes Maternal Uncle     SOCIAL HISTORY: Social History   Socioeconomic History   Marital status: Married    Spouse name: [name]   Number of children: 1   Years of education: 22   Highest education level: Not on file  Occupational History    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   Patient has one adult child.   Patient is working part-time and is retired.   Patient has a Probation officer.   Patient is right-handed.   Patient drinks four cups of coffee daily.   Social Drivers of Corporate investment banker Strain: Not on file  Food Insecurity: Not on file  Transportation Needs: Not on file  Physical Activity: Not on file  Stress: Not on file  Social Connections: Not on file  Intimate Partner Violence: Not on file      PHYSICAL EXAM  Vitals:   08/06/23 1403  BP: (!) 154/88  Pulse: 74  Weight:  233 lb (105.7 kg)  Height: 6' 4 (1.93 m)       No data to display  08/06/2023    2:44 PM 07/14/2018   10:28 AM 02/26/2017    2:02 PM 02/27/2016    1:47 PM 08/29/2015    1:31 PM  Montreal Cognitive Assessment   Visuospatial/ Executive (0/5) 5 5 3 2 5   Naming (0/3) 3 3 3 3 3   Attention: Read list of digits (0/2) 2 2 2 2 1   Attention: Read list of letters (0/1) 0 1 1 1 1   Attention: Serial 7 subtraction starting at 100 (0/3) 3 1 3 3 3   Language: Repeat phrase (0/2) 2 1 1 1 2   Language : Fluency (0/1) 1 1 1 1 1   Abstraction (0/2) 2 2 2 2 2   Delayed Recall (0/5) 1 3 0 2 3  Orientation (0/6) 6 6 5 5 6   Total 25 25 21 22 27   Adjusted Score (based on education)    22 27    Body mass index is 28.36 kg/m.  Generalized: Well developed, in no acute distress  Chest: Lungs clear to auscultation bilaterally  Neurological examination  Mentation: Alert oriented to time, place, history taking. Follows all commands speech and language fluent Cranial nerve II-XII: Facial symmetry noted   DIAGNOSTIC DATA (LABS, IMAGING, TESTING) - I reviewed patient records, labs, notes, testing and imaging myself where available.  Lab Results  Component Value Date   WBC 10.3 06/07/2022   HGB 12.0 (L) 06/07/2022   HCT 36.8 (L) 06/07/2022   MCV 91.5 06/07/2022   PLT 183 06/07/2022      Component Value Date/Time   NA 141 06/07/2022 0829   K 4.2 06/07/2022 0829   CL 104 06/07/2022 0829   CO2 22 06/07/2022 0829   GLUCOSE 90 06/07/2022 0829   BUN 27 (H) 06/07/2022 0829   CREATININE 2.09 (H) 06/07/2022 0829   CALCIUM 9.2 06/07/2022 0829   PROT 6.8 01/30/2007 0430   ALBUMIN 3.7 01/30/2007 0430   AST 25 01/30/2007 0430   ALT 32 01/30/2007 0430   ALKPHOS 114 01/30/2007 0430   BILITOT 0.8 01/30/2007 0430   GFRNONAA 33 (L) 06/07/2022 0829   GFRAA 45 (L) 10/27/2011 2340       ASSESSMENT AND PLAN 73 y.o. 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 here with:  OSA on ASV  - CPAP compliance excellent - Residual AHI remains elevated - Mask refitting ordered - Encourage patient to use ASV nightly and > 4 hours each night -Patient requested that we check a MoCA-stable 25/30 - F/U in 1 year or sooner if needed    Duwaine Russell, MSN, NP-C 08/06/2023, 2:10 PM [facility] [address], [address] [place], [place] [address] [phone]  The patient's condition requires frequent monitoring and adjustments in the treatment plan, reflecting the ongoing complexity of care.  This provider is the continuing focal point for all needed services for this condition.

## 2023-08-15 DIAGNOSIS — G4731 Primary central sleep apnea: Secondary | ICD-10-CM | POA: Diagnosis not present

## 2023-08-21 DIAGNOSIS — E1042 Type 1 diabetes mellitus with diabetic polyneuropathy: Secondary | ICD-10-CM | POA: Diagnosis not present

## 2023-08-21 DIAGNOSIS — E1022 Type 1 diabetes mellitus with diabetic chronic kidney disease: Secondary | ICD-10-CM | POA: Diagnosis not present

## 2023-08-21 DIAGNOSIS — Z794 Long term (current) use of insulin: Secondary | ICD-10-CM | POA: Diagnosis not present

## 2023-08-21 DIAGNOSIS — N1832 Chronic kidney disease, stage 3b: Secondary | ICD-10-CM | POA: Diagnosis not present

## 2023-08-24 DIAGNOSIS — L814 Other melanin hyperpigmentation: Secondary | ICD-10-CM | POA: Diagnosis not present

## 2023-08-24 DIAGNOSIS — L821 Other seborrheic keratosis: Secondary | ICD-10-CM | POA: Diagnosis not present

## 2023-08-24 DIAGNOSIS — L570 Actinic keratosis: Secondary | ICD-10-CM | POA: Diagnosis not present

## 2023-08-24 DIAGNOSIS — D225 Melanocytic nevi of trunk: Secondary | ICD-10-CM | POA: Diagnosis not present

## 2023-08-24 DIAGNOSIS — Z85828 Personal history of other malignant neoplasm of skin: Secondary | ICD-10-CM | POA: Diagnosis not present

## 2023-08-26 DIAGNOSIS — E1065 Type 1 diabetes mellitus with hyperglycemia: Secondary | ICD-10-CM | POA: Diagnosis not present

## 2023-08-26 DIAGNOSIS — Z9641 Presence of insulin pump (external) (internal): Secondary | ICD-10-CM | POA: Diagnosis not present

## 2023-08-26 DIAGNOSIS — Z794 Long term (current) use of insulin: Secondary | ICD-10-CM | POA: Diagnosis not present

## 2023-09-02 DIAGNOSIS — Z9641 Presence of insulin pump (external) (internal): Secondary | ICD-10-CM | POA: Diagnosis not present

## 2023-09-02 DIAGNOSIS — E1065 Type 1 diabetes mellitus with hyperglycemia: Secondary | ICD-10-CM | POA: Diagnosis not present

## 2023-09-02 DIAGNOSIS — Z794 Long term (current) use of insulin: Secondary | ICD-10-CM | POA: Diagnosis not present

## 2023-09-09 DIAGNOSIS — E1065 Type 1 diabetes mellitus with hyperglycemia: Secondary | ICD-10-CM | POA: Diagnosis not present

## 2023-09-09 DIAGNOSIS — Z9641 Presence of insulin pump (external) (internal): Secondary | ICD-10-CM | POA: Diagnosis not present

## 2023-09-09 DIAGNOSIS — Z794 Long term (current) use of insulin: Secondary | ICD-10-CM | POA: Diagnosis not present

## 2023-09-16 DIAGNOSIS — Z9641 Presence of insulin pump (external) (internal): Secondary | ICD-10-CM | POA: Diagnosis not present

## 2023-09-16 DIAGNOSIS — Z794 Long term (current) use of insulin: Secondary | ICD-10-CM | POA: Diagnosis not present

## 2023-09-16 DIAGNOSIS — E1065 Type 1 diabetes mellitus with hyperglycemia: Secondary | ICD-10-CM | POA: Diagnosis not present

## 2023-10-06 DIAGNOSIS — E1065 Type 1 diabetes mellitus with hyperglycemia: Secondary | ICD-10-CM | POA: Diagnosis not present

## 2023-10-06 DIAGNOSIS — Z9641 Presence of insulin pump (external) (internal): Secondary | ICD-10-CM | POA: Diagnosis not present

## 2023-10-06 DIAGNOSIS — Z794 Long term (current) use of insulin: Secondary | ICD-10-CM | POA: Diagnosis not present

## 2023-10-12 DIAGNOSIS — H40053 Ocular hypertension, bilateral: Secondary | ICD-10-CM | POA: Diagnosis not present

## 2023-10-12 DIAGNOSIS — H34831 Tributary (branch) retinal vein occlusion, right eye, with macular edema: Secondary | ICD-10-CM | POA: Diagnosis not present

## 2023-10-13 DIAGNOSIS — Z9641 Presence of insulin pump (external) (internal): Secondary | ICD-10-CM | POA: Diagnosis not present

## 2023-10-13 DIAGNOSIS — Z794 Long term (current) use of insulin: Secondary | ICD-10-CM | POA: Diagnosis not present

## 2023-10-13 DIAGNOSIS — E1065 Type 1 diabetes mellitus with hyperglycemia: Secondary | ICD-10-CM | POA: Diagnosis not present

## 2023-10-15 DIAGNOSIS — G4731 Primary central sleep apnea: Secondary | ICD-10-CM | POA: Diagnosis not present

## 2023-10-20 DIAGNOSIS — Z9641 Presence of insulin pump (external) (internal): Secondary | ICD-10-CM | POA: Diagnosis not present

## 2023-10-20 DIAGNOSIS — E1065 Type 1 diabetes mellitus with hyperglycemia: Secondary | ICD-10-CM | POA: Diagnosis not present

## 2023-10-20 DIAGNOSIS — Z794 Long term (current) use of insulin: Secondary | ICD-10-CM | POA: Diagnosis not present

## 2023-10-27 DIAGNOSIS — M5459 Other low back pain: Secondary | ICD-10-CM | POA: Diagnosis not present

## 2023-10-27 DIAGNOSIS — Z9641 Presence of insulin pump (external) (internal): Secondary | ICD-10-CM | POA: Diagnosis not present

## 2023-10-27 DIAGNOSIS — E1065 Type 1 diabetes mellitus with hyperglycemia: Secondary | ICD-10-CM | POA: Diagnosis not present

## 2023-10-27 DIAGNOSIS — Z794 Long term (current) use of insulin: Secondary | ICD-10-CM | POA: Diagnosis not present

## 2023-11-04 DIAGNOSIS — H40053 Ocular hypertension, bilateral: Secondary | ICD-10-CM | POA: Diagnosis not present

## 2023-11-04 DIAGNOSIS — H34831 Tributary (branch) retinal vein occlusion, right eye, with macular edema: Secondary | ICD-10-CM | POA: Diagnosis not present

## 2023-11-06 ENCOUNTER — Other Ambulatory Visit: Payer: Self-pay

## 2023-11-06 ENCOUNTER — Emergency Department (HOSPITAL_COMMUNITY): Admission: EM | Admit: 2023-11-06 | Discharge: 2023-11-06 | Disposition: A | Discharge status: Home / Self Care

## 2023-11-06 ENCOUNTER — Emergency Department (HOSPITAL_COMMUNITY)

## 2023-11-06 ENCOUNTER — Encounter (HOSPITAL_COMMUNITY): Payer: Self-pay

## 2023-11-06 DIAGNOSIS — Z794 Long term (current) use of insulin: Secondary | ICD-10-CM | POA: Diagnosis not present

## 2023-11-06 DIAGNOSIS — E119 Type 2 diabetes mellitus without complications: Secondary | ICD-10-CM | POA: Insufficient documentation

## 2023-11-06 DIAGNOSIS — T18128A Food in esophagus causing other injury, initial encounter: Secondary | ICD-10-CM | POA: Diagnosis not present

## 2023-11-06 DIAGNOSIS — Z79899 Other long term (current) drug therapy: Secondary | ICD-10-CM | POA: Diagnosis not present

## 2023-11-06 DIAGNOSIS — I10 Essential (primary) hypertension: Secondary | ICD-10-CM | POA: Diagnosis not present

## 2023-11-06 DIAGNOSIS — K56609 Unspecified intestinal obstruction, unspecified as to partial versus complete obstruction: Secondary | ICD-10-CM | POA: Diagnosis not present

## 2023-11-06 DIAGNOSIS — W44F3XA Food entering into or through a natural orifice, initial encounter: Secondary | ICD-10-CM | POA: Insufficient documentation

## 2023-11-06 LAB — BASIC METABOLIC PANEL WITH GFR
Anion gap: 11 (ref 5–15)
BUN: 33 mg/dL — ABNORMAL HIGH (ref 8–23)
CO2: 26 mmol/L (ref 22–32)
Calcium: 9.5 mg/dL (ref 8.9–10.3)
Chloride: 106 mmol/L (ref 98–111)
Creatinine, Ser: 2.16 mg/dL — ABNORMAL HIGH (ref 0.61–1.24)
GFR, Estimated: 32 mL/min — ABNORMAL LOW (ref >60)
Glucose, Bld: 104 mg/dL — ABNORMAL HIGH (ref 70–99)
Potassium: 5.2 mmol/L — ABNORMAL HIGH (ref 3.5–5.1)
Sodium: 143 mmol/L (ref 135–145)

## 2023-11-06 LAB — CBC
HCT: 39.1 % (ref 39.0–52.0)
Hemoglobin: 12.4 g/dL — ABNORMAL LOW (ref 13.0–17.0)
MCH: 29 pg (ref 26.0–34.0)
MCHC: 31.7 g/dL (ref 30.0–36.0)
MCV: 91.4 fL (ref 80.0–100.0)
Platelets: 260 K/uL (ref 150–400)
RBC: 4.28 MIL/uL (ref 4.22–5.81)
RDW: 13.1 % (ref 11.5–15.5)
WBC: 13.6 K/uL — ABNORMAL HIGH (ref 4.0–10.5)
nRBC: 0 % (ref 0.0–0.2)

## 2023-11-06 MED ORDER — ONDANSETRON HCL 4 MG/2ML IJ SOLN
4.0000 mg | Freq: Once | INTRAMUSCULAR | Status: AC
  Administered 2023-11-06: 4 mg via INTRAVENOUS
  Filled 2023-11-06: qty 2

## 2023-11-06 MED ORDER — GLUCAGON HCL RDNA (DIAGNOSTIC) 1 MG IJ SOLR
1.0000 mg | Freq: Once | INTRAMUSCULAR | Status: AC
  Administered 2023-11-06: 1 mg via INTRAVENOUS
  Filled 2023-11-06: qty 1

## 2023-11-06 NOTE — ED Triage Notes (Signed)
 Pt reports that he has been unable to swallow x20 hours. Pt states that he thinks that something is stuck in his throat. Pt also endorses SHOB. Pt speaking in complete sentences at this time.

## 2023-11-06 NOTE — ED Provider Notes (Signed)
 [place] EMERGENCY DEPARTMENT AT [facility] Provider Note   CSN: [id number] Arrival date & time: 11/06/23  1645     Patient presents with: Foreign Body   [name] is a 73 y.o. male.   73 year old male with past medical history of diabetes and hypertension presenting to the emergency department today with concern for esophageal food impaction.  Patient states that last night he was eating some leftover steak when he noted that the steak seem to get stuck.  Has not been able to drink any fluids today.  The patient is type I diabetic and his blood sugars been okay but he is concerned about this as he has not anything to eat or drink today.  He came to the ER for further evaluation regarding this.  He does report some mild epigastric discomfort since then after having a few episodes of vomiting last night.   Foreign Body Associated symptoms: abdominal pain        Prior to Admission medications   Medication Sig Start Date End Date Taking? Authorizing Provider  amLODipine (NORVASC) 5 MG tablet Take 5 mg by mouth daily.    [provider]  butalbital-aspirin-caffeine (FIORINAL) 50-325-40 MG capsule TAKE 1 CAPSULE BY MOUTH EVERY 4 HOURS AS NEEDED FOR SEVERE MIGRAINE 03/18/18   [provider]  CINNAMON PO Take 2 tablets by mouth daily.    [provider]  citalopram (CELEXA) 20 MG tablet Take 20 mg by mouth daily.    [provider]  cloNIDine (CATAPRES) 0.1 MG tablet Take 0.1 mg by mouth daily as needed (for high BP).    [provider]  diclofenac sodium (VOLTAREN) 1 % GEL APPLY 2 GRAMS EVERY 6 HOURS AS NEEDED FOR SHOULDER PAIN 01/25/18   [provider]  fluocinonide cream (LIDEX) 0.05 %  02/10/15   [provider]  HUMALOG 100 UNIT/ML injection USE 90 UNITS DAY VIA INSULIN  PUMP SUBCUTANEOUS 07/12/18   [provider]  hydrochlorothiazide (MICROZIDE) 12.5 MG capsule Take 25 mg by mouth daily.      [provider]  ibuprofen  (ADVIL ,MOTRIN ) 200 MG tablet Take 200-400 mg by mouth every 6 (six) hours as needed for pain.    [provider]  insulin  aspart protamine- aspart (NOVOLOG  MIX 70/30) (70-30) 100 UNIT/ML injection Inject 0.12 mLs (12 Units total) into the skin daily with supper. 06/07/22   Victor Lynwood DASEN, PA-C  Insulin  Human (INSULIN  PUMP) 100 unit/ml SOLN Inject 70 each into the skin continuous. Humalog insulin     [provider]  lisinopril (ZESTRIL) 10 MG tablet Take 10 mg by mouth daily. 05/07/18   [provider]  LISINOPRIL PO Take by mouth.    [provider]  Multiple Vitamin (MULTIVITAMIN WITH MINERALS) TABS tablet Take 1 tablet by mouth daily.    [provider]  olmesartan (BENICAR) 20 MG tablet Take 20 mg by mouth daily. 01/04/20   [provider]  omeprazole (PRILOSEC) 40 MG capsule Take by mouth.    [provider]  Polyethyl Glycol-Propyl Glycol 0.4-0.3 % SOLN Apply 2 drops to eye 3 (three) times daily.    [provider]  vitamin C (ASCORBIC ACID) 500 MG tablet Take 500 mg by mouth daily.    [provider]    Allergies: Codeine, Dilaudid [hydromorphone hcl], Gluten meal, Morphine and codeine, Other, Toradol  [ketorolac  tromethamine ], and Penicillins    Review of Systems  Gastrointestinal:  Positive for abdominal pain.  All other systems  reviewed and are negative.   Updated Vital Signs BP (!) 150/95   Pulse 84   Temp 98.7 F (37.1 C) (Oral)   Resp 16   Ht 6' 4 (1.93 m)   Wt 104.3 kg   SpO2 99%   BMI 28.00 kg/m   Physical Exam Vitals and nursing note reviewed.   Gen: NAD Eyes: PERRL, EOMI HEENT: no oropharyngeal swelling Neck: trachea midline Resp: clear to auscultation bilaterally Card: RRR, no murmurs, rubs, or gallops Abd: Mild epigastric tenderness, no guarding or rebound Extremities: no calf tenderness, no edema Vascular: 2+ radial pulses bilaterally, 2+ DP  pulses bilaterally Skin: no rashes Psyc: acting appropriately   (all labs ordered are listed, but only abnormal results are displayed) Labs Reviewed  BASIC METABOLIC PANEL WITH GFR - Abnormal; Notable for the following components:      Result Value   Potassium 5.2 (*)    Glucose, Bld 104 (*)    BUN 33 (*)    Creatinine, Ser 2.16 (*)    GFR, Estimated 32 (*)    All other components within normal limits  CBC - Abnormal; Notable for the following components:   WBC 13.6 (*)    Hemoglobin 12.4 (*)    All other components within normal limits    EKG: None  Radiology: DG Chest 2 View Result Date: 11/06/2023 EXAM: 2 VIEW(S) XRAY OF THE CHEST 11/06/2023 05:24:00 PM COMPARISON: 02/02/2007 CLINICAL HISTORY: SHOB FINDINGS: LUNGS AND PLEURA: No focal pulmonary opacity. No pulmonary edema. No pleural effusion. No pneumothorax. HEART AND MEDIASTINUM: No acute abnormality of the cardiac and mediastinal silhouettes. BONES AND SOFT TISSUES: No acute osseous abnormality. IMPRESSION: 1. No acute cardiopulmonary abnormality. Electronically signed by: Lynwood Seip MD 11/06/2023 05:30 PM EDT RP Workstation: [id number]     Procedures   Medications Ordered in the ED  glucagon (human recombinant) (GLUCAGEN) injection 1 mg (1 mg Intravenous Given 11/06/23 2043)  ondansetron (ZOFRAN) injection 4 mg (4 mg Intravenous Given 11/06/23 2048)                                    Medical Decision Making 73 year old male with past medical history of diabetes and hypertension presenting to the emergency department today with concern for esophageal food impaction.  Will give the patient IV fluids here as well as glucagon.  If this unsuccessful the patient will require endoscopy.  His labs from triage are largely unremarkable with exception of some mild hyperkalemia and his chest x-ray is clear.  The patient's work appears reassuring.  His symptoms actually resolved after the glucagon.  The patient was able to  tolerate some solids as well as liquids here and was observed for a brief period after they got administration with no vomiting or significant issues.  He is discharged with return precautions with GI follow-up.  Amount and/or Complexity of Data Reviewed Labs: ordered. Radiology: ordered.  Risk Prescription drug management.        Final diagnoses:  Food impaction of esophagus, initial encounter    ED Discharge Orders     None          Ula Prentice SAUNDERS, MD 11/06/23 2212

## 2023-11-06 NOTE — Discharge Instructions (Signed)
 Your workup today was reassuring overall.  Please make sure to take the omeprazole as prescribed.  Please call and schedule a follow-up appointment with the GI doctors at the number provided.  Avoid steak or chicken over the next week or 2 and you may want to try more of a soft diet over the next day or so.  Please follow-up with your doctor and return to the ER for worsening symptoms.

## 2023-11-24 DIAGNOSIS — E1022 Type 1 diabetes mellitus with diabetic chronic kidney disease: Secondary | ICD-10-CM | POA: Diagnosis not present

## 2023-11-24 DIAGNOSIS — E1042 Type 1 diabetes mellitus with diabetic polyneuropathy: Secondary | ICD-10-CM | POA: Diagnosis not present

## 2023-11-24 DIAGNOSIS — Z794 Long term (current) use of insulin: Secondary | ICD-10-CM | POA: Diagnosis not present

## 2023-11-24 DIAGNOSIS — N1832 Chronic kidney disease, stage 3b: Secondary | ICD-10-CM | POA: Diagnosis not present

## 2023-12-07 ENCOUNTER — Other Ambulatory Visit: Payer: Self-pay | Admitting: Gastroenterology

## 2023-12-07 DIAGNOSIS — Z1211 Encounter for screening for malignant neoplasm of colon: Secondary | ICD-10-CM | POA: Diagnosis not present

## 2023-12-07 DIAGNOSIS — R1310 Dysphagia, unspecified: Secondary | ICD-10-CM | POA: Diagnosis not present

## 2024-01-13 ENCOUNTER — Encounter (HOSPITAL_COMMUNITY): Payer: Self-pay

## 2024-01-13 ENCOUNTER — Other Ambulatory Visit: Payer: Self-pay

## 2024-01-13 ENCOUNTER — Emergency Department (HOSPITAL_COMMUNITY)

## 2024-01-13 ENCOUNTER — Emergency Department (HOSPITAL_COMMUNITY)
Admission: EM | Admit: 2024-01-13 | Discharge: 2024-01-14 | Disposition: A | Discharge status: Home / Self Care | Attending: Emergency Medicine | Admitting: Emergency Medicine

## 2024-01-13 DIAGNOSIS — I10 Essential (primary) hypertension: Secondary | ICD-10-CM | POA: Insufficient documentation

## 2024-01-13 DIAGNOSIS — Y9222 Religious institution as the place of occurrence of the external cause: Secondary | ICD-10-CM | POA: Insufficient documentation

## 2024-01-13 DIAGNOSIS — R519 Headache, unspecified: Secondary | ICD-10-CM | POA: Diagnosis present

## 2024-01-13 DIAGNOSIS — W101XXA Fall (on)(from) sidewalk curb, initial encounter: Secondary | ICD-10-CM | POA: Insufficient documentation

## 2024-01-13 DIAGNOSIS — S0003XA Contusion of scalp, initial encounter: Secondary | ICD-10-CM | POA: Insufficient documentation

## 2024-01-13 DIAGNOSIS — S0990XA Unspecified injury of head, initial encounter: Secondary | ICD-10-CM

## 2024-01-13 DIAGNOSIS — Z794 Long term (current) use of insulin: Secondary | ICD-10-CM | POA: Insufficient documentation

## 2024-01-13 DIAGNOSIS — W19XXXA Unspecified fall, initial encounter: Secondary | ICD-10-CM

## 2024-01-13 DIAGNOSIS — E109 Type 1 diabetes mellitus without complications: Secondary | ICD-10-CM | POA: Insufficient documentation

## 2024-01-13 NOTE — ED Triage Notes (Addendum)
 Pt BIB [facility] due to a fall at [place]. Pt was holding door open for wife when he tripped over sidewalk and fell backwards hitting his head. No LOC, pt is A&Ox4. Bleeding is controlled and possible laceration to the center back of the head.   Pt denies blood thinners.   Hx of falls   Pt c/o of pain in head, back, and neck pain.  C - collar in place   EMS vitals 178/96  78 HR  97% Spo02  16 RR  98.0 oral temp  160 CBG - wears CGM monitor

## 2024-01-13 NOTE — ED Provider Triage Note (Signed)
 Emergency Medicine Provider Triage Evaluation Note  [name] , a 74 y.o. male  was evaluated in triage.  Pt complains of mechanical fall with head injury.  Patient was holding the door for his wife at church when he stepped back and tripped over a curb falling backwards and hitting his head on the ground.  Sustained an abrasion with minimal bleeding.  No LOC. No Ac. Complaining of posterior head and upper neck pain, cervical collar in place.  Denies any other injury.  Review of Systems  Positive: Head and neck pain Negative: LOC, neurodeficit  Physical Exam  BP (!) 164/87 (BP Location: Left Arm)   Pulse 72   Temp 98 F (36.7 C) (Oral)   Ht 6' 4 (1.93 m)   Wt 104.3 kg   SpO2 95%   BMI 27.99 kg/m  Gen:   Awake, no distress, c-collar in place Resp:  Normal effort  MSK:   Moves extremities without difficulty    Medical Decision Making  Medically screening exam initiated at 10:48 PM.  Appropriate orders placed.  [name] was informed that the remainder of the evaluation will be completed by another provider, this initial triage assessment does not replace that evaluation, and the importance of remaining in the ED until their evaluation is complete.  74 year old male presents emergency department after mechanical fall back with head injury.  No LOC.  No anticoagulation.  Denies any other acute injury.  CT imaging ordered.  Patient evaluated sitting up in chair, fully clothed, limited PE. Orders placed. Patient was counseled that they need to remain in the ED until the completion of their work-up including a full H&P, additional testing and results of any tests.  The patient appears stable and the remainder of the encounter may be completed by another provider.   Robert Roxie HERO, DO 01/13/24 2250

## 2024-01-13 NOTE — Discharge Instructions (Signed)
You were seen in the Emergency Department (ED) today for a head injury.  Based on your evaluation, you may have sustained a concussion (or bruise) to your brain.  If you had a CT scan done, it did not show any evidence of serious injury or bleeding.    Symptoms to expect from a concussion include nausea, mild to moderate headache, difficulty concentrating or sleeping, and mild lightheadedness.  These symptoms should improve over the next few days to weeks, but it may take many weeks before you feel back to normal.  Return to the emergency department or follow-up with your primary care doctor if your symptoms are not improving over this time.  Signs of a more serious head injury include vomiting, severe headache, excessive sleepiness or confusion, and weakness or numbness in your face, arms or legs.  Return immediately to the Emergency Department if you experience any of these more concerning symptoms.    Rest, avoid strenuous physical or mental activity, and avoid activities that could potentially result in another head injury until all your symptoms from this head injury are completely resolved for at least 2-3 weeks.  You may take ibuprofen or acetaminophen over the counter according to label instructions for mild headache or scalp soreness.  

## 2024-01-13 NOTE — ED Provider Notes (Signed)
 "  Emergency Department Provider Note   I have reviewed the triage vital signs and the nursing notes.   HISTORY  Chief Complaint Fall (Neck, head and back pain /)   HPI [name] is a 74 y.o. male with past history reviewed below presents emergency department after mechanical fall.  Patient was stepping backward when he hit the back of his heel on a curb falling straight back and striking the back of his head.  There is a small amount of blood.  No loss of consciousness.  Reports some neck stiffness as well. No anticoagulation.    Past Medical History:  Diagnosis Date   Anxiety    Arthritis    Diabetes mellitus without complication (HCC)    type 1, insulin  pump   Head injury 09/16/2012   12 stitches   Hypertension    Migraine    Narcolepsy without cataplexy with hypocretin deficiency 11/23/2013    HLA testing positive for narcolepsy    OSA (obstructive sleep apnea)    OSA treated with BiPAP    Sleep apnea with use of continuous positive airway pressure (CPAP)    sleep study revealed centrals, adapt SV EPAP at 10 cm water 02-19-11     Review of Systems  Constitutional: No fever/chills Cardiovascular: Denies chest pain. Respiratory: Denies shortness of breath. Gastrointestinal: No abdominal pain.  No nausea, no vomiting.   Musculoskeletal: Positive cervical spine tightness.  Skin: Positive posterior scalp abrasion.  Neurological: Positive HA.   ____________________________________________   PHYSICAL EXAM:  VITAL SIGNS: ED Triage Vitals  Encounter Vitals Group     BP 01/13/24 2135 (!) 164/87     Pulse Rate 01/13/24 2135 72     Resp --      Temp 01/13/24 2135 98 F (36.7 C)     Temp Source 01/13/24 2135 Oral     SpO2 01/13/24 2135 95 %     Weight 01/13/24 2131 229 lb 15 oz (104.3 kg)     Height 01/13/24 2131 6' 4 (1.93 m)   Constitutional: Alert and oriented. Well appearing and in no acute distress. Eyes: Conjunctivae are normal.  Head: Occipital  scalp hematoma with abrasion. No lacerations.  Nose: No congestion/rhinnorhea. Mouth/Throat: Mucous membranes are moist.  Neck: No stridor.  No cervical spine tenderness to palpation. Cardiovascular: Normal rate, regular rhythm. Good peripheral circulation. Grossly normal heart sounds.   Respiratory: Normal respiratory effort.  No retractions. Lungs CTAB. Gastrointestinal: No distention.  Musculoskeletal: No lower extremity tenderness nor edema. No gross deformities of extremities. Neurologic:  Normal speech and language.  Skin:  Skin is warm, dry and intact. No rash noted.  ____________________________________________  RADIOLOGY  CT Cervical Spine Wo Contrast Result Date: 01/13/2024 EXAM: CT CERVICAL SPINE WITHOUT CONTRAST 01/13/2024 11:18:06 PM TECHNIQUE: CT of the cervical spine was performed without the administration of intravenous contrast. Multiplanar reformatted images are provided for review. Automated exposure control, iterative reconstruction, and/or weight based adjustment of the mA/kV was utilized to reduce the radiation dose to as low as reasonably achievable. COMPARISON: None available. CLINICAL HISTORY: Polytrauma, blunt. FINDINGS: BONES AND ALIGNMENT: No acute fracture or traumatic malalignment. DEGENERATIVE CHANGES: Degenerative disc disease at C5-C6 with disc space narrowing and spurring. Bilateral neural foraminal narrowing at C5-C6. Bilateral degenerative facet disease. SOFT TISSUES: No prevertebral soft tissue swelling. IMPRESSION: 1. No evidence of acute traumatic injury. Electronically signed by: Franky Crease MD MD 01/13/2024 11:23 PM EST RP Workstation: [id number]   CT Head Wo Contrast Result Date: 01/13/2024  EXAM: CT HEAD WITHOUT CONTRAST 01/13/2024 11:18:06 PM TECHNIQUE: CT of the head was performed without the administration of intravenous contrast. Automated exposure control, iterative reconstruction, and/or weight based adjustment of the mA/kV was utilized to reduce the  radiation dose to as low as reasonably achievable. COMPARISON: 09/16/2012 CLINICAL HISTORY: Polytrauma, blunt FINDINGS: BRAIN AND VENTRICLES: No acute hemorrhage. Small bilateral thalamic lacunar infarcts. No hydrocephalus. No extra-axial collection. No mass effect or midline shift. ORBITS: No acute abnormality. SINUSES: No acute abnormality. SOFT TISSUES AND SKULL: No acute soft tissue abnormality. No skull fracture. IMPRESSION: 1. No acute intracranial abnormality. 2. Small bilateral thalamic lacunar infarcts. Electronically signed by: Franky Crease MD MD 01/13/2024 11:22 PM EST RP Workstation: [id number]    ____________________________________________   PROCEDURES  Procedure(s) performed:   Procedures  None  ____________________________________________   INITIAL IMPRESSION / ASSESSMENT AND PLAN / ED COURSE  Pertinent labs & imaging results that were available during my care of the patient were reviewed by me and considered in my medical decision making (see chart for details).   This patient is Presenting for Evaluation of fall, which does require a range of treatment options, and is a complaint that involves a high risk of morbidity and mortality.  The Differential Diagnoses includes subdural hematoma, epidural hematoma, acute concussion, traumatic subarachnoid hemorrhage, cerebral contusions, etc.  Radiologic Tests Ordered, included CT head and C spine. I independently interpreted the images and agree with radiology interpretation.   Medical Decision Making: Summary:  Patient presents emergency department after mechanical fall.  CT imaging from triage unremarkable.  He has an abrasion to the occipital scalp with hematoma but no laceration.  He is awake and alert with normal neuroexam.  Stable for discharge.   Patient's presentation is most consistent with acute presentation with potential threat to life or bodily function.   Disposition:  discharge  ____________________________________________  FINAL CLINICAL IMPRESSION(S) / ED DIAGNOSES  Final diagnoses:  Fall, initial encounter  Injury of head, initial encounter  Hematoma of scalp, initial encounter    Note:  This document was prepared using Dragon voice recognition software and may include unintentional dictation errors.  Fonda Law, MD, [facility]    Lorrain Rivers, Fonda MATSU, MD 01/14/24 [phone]  "

## 2024-02-04 ENCOUNTER — Other Ambulatory Visit

## 2024-02-24 ENCOUNTER — Other Ambulatory Visit

## 2024-03-10 ENCOUNTER — Other Ambulatory Visit

## 2024-08-08 ENCOUNTER — Ambulatory Visit: Admitting: Neurology
